# Patient Record
Sex: Female | Born: 1960
Health system: Southern US, Community
[De-identification: ages and names within clinical notes are randomized; demographics above are authoritative.]

## PROBLEM LIST (undated history)

## (undated) DIAGNOSIS — Z800 Family history of malignant neoplasm of digestive organs: Secondary | ICD-10-CM

## (undated) DIAGNOSIS — R112 Nausea with vomiting, unspecified: Secondary | ICD-10-CM

## (undated) DIAGNOSIS — N6019 Diffuse cystic mastopathy of unspecified breast: Secondary | ICD-10-CM

## (undated) DIAGNOSIS — Z9889 Other specified postprocedural states: Secondary | ICD-10-CM

## (undated) DIAGNOSIS — K625 Hemorrhage of anus and rectum: Secondary | ICD-10-CM

## (undated) DIAGNOSIS — Z1211 Encounter for screening for malignant neoplasm of colon: Secondary | ICD-10-CM

## (undated) DIAGNOSIS — M1990 Unspecified osteoarthritis, unspecified site: Secondary | ICD-10-CM

## (undated) DIAGNOSIS — E039 Hypothyroidism, unspecified: Secondary | ICD-10-CM

## (undated) DIAGNOSIS — T8859XA Other complications of anesthesia, initial encounter: Secondary | ICD-10-CM

## (undated) DIAGNOSIS — F419 Anxiety disorder, unspecified: Secondary | ICD-10-CM

## (undated) DIAGNOSIS — T4145XA Adverse effect of unspecified anesthetic, initial encounter: Secondary | ICD-10-CM

## (undated) DIAGNOSIS — K648 Other hemorrhoids: Secondary | ICD-10-CM

## (undated) DIAGNOSIS — B019 Varicella without complication: Secondary | ICD-10-CM

## (undated) HISTORY — DX: Varicella without complication: B01.9

## (undated) HISTORY — DX: Other hemorrhoids: K64.8

## (undated) HISTORY — DX: Diffuse cystic mastopathy of unspecified breast: N60.19

## (undated) HISTORY — DX: Encounter for screening for malignant neoplasm of colon: Z12.11

## (undated) HISTORY — DX: Family history of malignant neoplasm of digestive organs: Z80.0

## (undated) HISTORY — DX: Hemorrhage of anus and rectum: K62.5

## (undated) HISTORY — PX: BREAST EXCISIONAL BIOPSY: SUR124

## (undated) HISTORY — DX: Other specified postprocedural states: Z98.890

## (undated) HISTORY — DX: Hypothyroidism, unspecified: E03.9

---
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
## 1989-01-23 HISTORY — PX: LYMPH NODE BIOPSY: SHX201

## 2001-01-23 DIAGNOSIS — Z9889 Other specified postprocedural states: Secondary | ICD-10-CM

## 2001-01-23 HISTORY — DX: Other specified postprocedural states: Z98.890

## 2004-02-05 ENCOUNTER — Ambulatory Visit: Payer: Self-pay | Admitting: Unknown Physician Specialty

## 2005-03-21 ENCOUNTER — Ambulatory Visit: Payer: Self-pay | Admitting: Unknown Physician Specialty

## 2006-04-03 ENCOUNTER — Ambulatory Visit: Payer: Self-pay | Admitting: Unknown Physician Specialty

## 2007-04-09 ENCOUNTER — Ambulatory Visit: Payer: Self-pay | Admitting: General Surgery

## 2008-01-24 HISTORY — PX: COLONOSCOPY: SHX174

## 2008-03-20 ENCOUNTER — Ambulatory Visit: Payer: Self-pay | Admitting: General Surgery

## 2008-03-25 ENCOUNTER — Ambulatory Visit: Payer: Self-pay | Admitting: General Surgery

## 2008-04-23 ENCOUNTER — Ambulatory Visit: Payer: Self-pay | Admitting: General Surgery

## 2009-01-23 DIAGNOSIS — K648 Other hemorrhoids: Secondary | ICD-10-CM

## 2009-01-23 DIAGNOSIS — K625 Hemorrhage of anus and rectum: Secondary | ICD-10-CM

## 2009-01-23 HISTORY — DX: Hemorrhage of anus and rectum: K62.5

## 2009-01-23 HISTORY — DX: Other hemorrhoids: K64.8

## 2009-05-19 ENCOUNTER — Ambulatory Visit: Payer: Self-pay | Admitting: Unknown Physician Specialty

## 2009-05-19 ENCOUNTER — Ambulatory Visit: Payer: Self-pay | Admitting: General Surgery

## 2009-07-20 DIAGNOSIS — D239 Other benign neoplasm of skin, unspecified: Secondary | ICD-10-CM

## 2009-07-20 HISTORY — DX: Other benign neoplasm of skin, unspecified: D23.9

## 2010-05-23 ENCOUNTER — Ambulatory Visit: Payer: Self-pay | Admitting: General Surgery

## 2011-01-24 DIAGNOSIS — Z1211 Encounter for screening for malignant neoplasm of colon: Secondary | ICD-10-CM

## 2011-01-24 DIAGNOSIS — Z800 Family history of malignant neoplasm of digestive organs: Secondary | ICD-10-CM

## 2011-01-24 DIAGNOSIS — N6019 Diffuse cystic mastopathy of unspecified breast: Secondary | ICD-10-CM

## 2011-01-24 HISTORY — DX: Family history of malignant neoplasm of digestive organs: Z80.0

## 2011-01-24 HISTORY — DX: Encounter for screening for malignant neoplasm of colon: Z12.11

## 2011-01-24 HISTORY — DX: Diffuse cystic mastopathy of unspecified breast: N60.19

## 2011-05-31 ENCOUNTER — Ambulatory Visit: Payer: Self-pay | Admitting: General Surgery

## 2012-03-20 ENCOUNTER — Emergency Department: Payer: Self-pay | Admitting: Emergency Medicine

## 2012-04-06 ENCOUNTER — Encounter: Payer: Self-pay | Admitting: *Deleted

## 2012-04-06 DIAGNOSIS — Z9889 Other specified postprocedural states: Secondary | ICD-10-CM | POA: Insufficient documentation

## 2012-04-06 DIAGNOSIS — Z800 Family history of malignant neoplasm of digestive organs: Secondary | ICD-10-CM | POA: Insufficient documentation

## 2013-11-24 ENCOUNTER — Encounter: Payer: Self-pay | Admitting: *Deleted

## 2014-07-02 LAB — HM MAMMOGRAPHY

## 2014-10-05 ENCOUNTER — Ambulatory Visit (INDEPENDENT_AMBULATORY_CARE_PROVIDER_SITE_OTHER): Payer: 59 | Admitting: Family Medicine

## 2014-10-05 ENCOUNTER — Encounter: Payer: Self-pay | Admitting: Family Medicine

## 2014-10-05 ENCOUNTER — Encounter (INDEPENDENT_AMBULATORY_CARE_PROVIDER_SITE_OTHER): Payer: Self-pay

## 2014-10-05 VITALS — BP 98/62 | HR 61 | Temp 98.2°F | Ht 64.25 in | Wt 113.2 lb

## 2014-10-05 DIAGNOSIS — E039 Hypothyroidism, unspecified: Secondary | ICD-10-CM | POA: Diagnosis not present

## 2014-10-05 DIAGNOSIS — M8580 Other specified disorders of bone density and structure, unspecified site: Secondary | ICD-10-CM

## 2014-10-05 DIAGNOSIS — R631 Polydipsia: Secondary | ICD-10-CM

## 2014-10-05 DIAGNOSIS — Z13220 Encounter for screening for lipoid disorders: Secondary | ICD-10-CM

## 2014-10-05 DIAGNOSIS — Z0000 Encounter for general adult medical examination without abnormal findings: Secondary | ICD-10-CM

## 2014-10-05 DIAGNOSIS — M2550 Pain in unspecified joint: Secondary | ICD-10-CM | POA: Diagnosis not present

## 2014-10-05 LAB — LIPID PANEL
CHOL/HDL RATIO: 3
Cholesterol: 253 mg/dL — ABNORMAL HIGH (ref 0–200)
HDL: 100.9 mg/dL (ref >39.00)
LDL Cholesterol: 136 mg/dL — ABNORMAL HIGH (ref 0–99)
NONHDL: 151.86
TRIGLYCERIDES: 81 mg/dL (ref 0.0–149.0)
VLDL: 16.2 mg/dL (ref 0.0–40.0)

## 2014-10-05 LAB — BASIC METABOLIC PANEL
BUN: 18 mg/dL (ref 6–23)
CALCIUM: 9.9 mg/dL (ref 8.4–10.5)
CO2: 32 meq/L (ref 19–32)
CREATININE: 0.82 mg/dL (ref 0.40–1.20)
Chloride: 103 mEq/L (ref 96–112)
GFR: 77.81 mL/min (ref >60.00)
GLUCOSE: 73 mg/dL (ref 70–99)
Potassium: 4.7 mEq/L (ref 3.5–5.1)
Sodium: 142 mEq/L (ref 135–145)

## 2014-10-05 LAB — VITAMIN D 25 HYDROXY (VIT D DEFICIENCY, FRACTURES): VITD: 23.6 ng/mL — ABNORMAL LOW (ref 30.00–100.00)

## 2014-10-05 LAB — CBC
HEMATOCRIT: 42.1 % (ref 36.0–46.0)
HEMOGLOBIN: 14 g/dL (ref 12.0–15.0)
MCHC: 33.4 g/dL (ref 30.0–36.0)
MCV: 95.6 fl (ref 78.0–100.0)
Platelets: 189 10*3/uL (ref 150.0–400.0)
RBC: 4.4 Mil/uL (ref 3.87–5.11)
RDW: 13 % (ref 11.5–15.5)
WBC: 4.2 10*3/uL (ref 4.0–10.5)

## 2014-10-05 LAB — SEDIMENTATION RATE: SED RATE: 6 mm/h (ref 0–22)

## 2014-10-05 LAB — C-REACTIVE PROTEIN: CRP: <0.1 mg/dL — ABNORMAL LOW (ref 0.5–20.0)

## 2014-10-05 NOTE — Assessment & Plan Note (Signed)
Stable per patient. Followed by endocrinology.

## 2014-10-05 NOTE — Assessment & Plan Note (Signed)
Obtaining vitamin D today. Advised continuation of vitamin D 2000 international units daily. Encouraged patient to take calcium supplementation to total 1200 mg daily (including dietary sources).  DEXA scan ordered.

## 2014-10-05 NOTE — Assessment & Plan Note (Addendum)
Preventative health care up to date except for hepatitis C screening and HIV screening.

## 2014-10-05 NOTE — Patient Instructions (Addendum)
It was nice to see you today.  Continue your current medications. Continue your dose of Vitamin D. Take 600-800 mg of calcium daily (total recommended including diet is 1200 mg daily).    We will call with lab and xray results.  Use Tylenol (1000 mg three times daily as needed for pain). You can also use Ibuprofen or Aleve (short term).  We will call regarding the scheduling of your Bone density.  Follow up in ~ 3 months.  Take care  Dr. Lacinda Axon

## 2014-10-05 NOTE — Progress Notes (Signed)
Subjective:  Patient ID: Sandra Baker, female    DOB: 12/09/1960  Age: 54 y.o. MRN: [id number]  CC: Joint pain  HPI Sandra Baker is a 54 y.o. female presents to the clinic today to establish care.  Additionally, she has complaints of joint pain.  1) Preventative Healthcare  Pap smear: 2016  Mammogram/Breast exam: 2016  Colonoscopy: 2014  Immunizations: Up to date  Labs: In need of Lipid, TSH.  Diet/Exercise: Exercises regularly (pilates/yoga).  Smoking/tobacco use: No.   Wears seat belt: yes.   2) Joint pain  Patient reports that she has had diffuse joint pain for the past few months.  She reports pain in the hands, fingers, wrists, ankles, feet.  She reports some morning stiffness. She states that it improves with massage/rubbing and physical activity.  She's not taking any medication for her pain.  She states that it is intermittently troublesome. Max 6/10.  No associated fevers or chills. She denies any weight loss or weight gain but states that her weight fluctuates secondary to hypothyroidism.  PMH, Surgical Hx, Family Hx, Social History reviewed and updated as below. Past Medical History  Diagnosis Date  . Diffuse cystic mastopathy 2013  . Hemorrhage of rectum and anus 2011  . Internal hemorrhoids with other complication 8882  . Family history of malignant neoplasm of gastrointestinal tract 2013  . Special screening for malignant neoplasms, colon 2013  . History of lymph node biopsy 2003  . Chicken pox   . Hypothyroidism     Past Surgical History  Procedure Laterality Date  . Colonoscopy  2010    done by Dr. Jamal Collin  . Lymph node biopsy  1991    Family History  Problem Relation Age of Onset  . Colon cancer Other     family history of colon cancer  . Cancer Other     cousin- breast cancer  . Cancer Mother     colon  . Cancer Maternal Grandfather     colon    Social History  Substance Use Topics  . Smoking status: Never  Smoker   . Smokeless tobacco: Never Used  . Alcohol Use: Not on file   Review of Systems  Constitutional: Negative for fever and chills.  HENT: Negative.   Eyes: Negative.   Respiratory: Negative for shortness of breath.   Cardiovascular: Negative for chest pain.  Gastrointestinal: Negative for nausea, vomiting and constipation.  Endocrine: Positive for polydipsia.  Genitourinary: Negative.   Musculoskeletal: Positive for joint swelling and arthralgias.  Neurological: Negative for dizziness, light-headedness and headaches.   Objective:   Today's Vitals: BP 98/62 mmHg  Pulse 61  Temp(Src) 98.2 F (36.8 C) (Oral)  Ht 5' 4.25" (1.632 m)  Wt 113 lb 3 oz (51.342 kg)  BMI 19.28 kg/m2  SpO2 98%  Physical Exam  Constitutional: She is oriented to person, place, and time. She appears well-developed and well-nourished. No distress.  HENT:  Head: Normocephalic and atraumatic.  Normal TM's bilaterally.   Eyes: Conjunctivae are normal. No scleral icterus.  Neck: Neck supple. No thyromegaly present.  Cardiovascular: Normal rate and regular rhythm.   No murmur heard. Pulmonary/Chest: Effort normal. No respiratory distress. She has no wheezes. She has no rales.  Abdominal: Soft. She exhibits no distension. There is no tenderness. There is no rebound and no guarding.  Musculoskeletal: She exhibits no edema or tenderness.  Normal ROM of hands/fingers/wrists/Ankles. 1st DIP (right) with swelling noted (likely from OA). Remainder of MSK exam negative.  Lymphadenopathy:    She has no cervical adenopathy.  Neurological: She is alert and oriented to person, place, and time.  Skin: Skin is warm and dry. No rash noted.  Psychiatric:  Depressed mood and flat affect.    Assessment & Plan:   Problem List Items Addressed This Visit    Excessive thirst    Patient reported this as I was getting ready to exit the room. Will obtain urine osmolality and basic metabolic panel to assess for diabetes  insipidus.      Relevant Orders   Osmolality, urine   Hypothyroidism    Stable per patient. Followed by endocrinology.      Joint pain    Exam was negative other than first DIP on the right with swelling. I suspect that she is having pain secondary to underlying OA.  However, patient is concerned about autoimmune conditions as she has Hashimoto's thyroiditis. Obtaining workup today: CBC, BMP, ANA, ESR, CRP, anti-CCP, as well as x-rays of the bilateral hands. Advised Tylenol and/or NSAIDs as needed for pain.      Relevant Orders   CBC   Basic metabolic panel   Sed Rate (ESR)   C-reactive protein   Cyclic citrul peptide antibody, IgG   Antinuclear Antib (ANA)   DG HANDS 1 VIEW BILAT BALLCATCHERS   Osteopenia    Obtaining vitamin D today. Advised continuation of vitamin D 2000 international units daily. Encouraged patient to take calcium supplementation to total 1200 mg daily (including dietary sources).  DEXA scan ordered.      Relevant Orders   Vitamin D (25 hydroxy)   DG Bone Density   Preventative health care - Primary    Preventative health care up to date except for hepatitis C screening and HIV screening.        Other Visit Diagnoses    Screening for lipid disorders        Relevant Orders    Lipid panel      Outpatient Encounter Prescriptions as of 10/05/2014  Medication Sig  . ACZONE 7.5 % GEL   . Cholecalciferol (D 1000) 1000 UNITS capsule Take 1,000 Units by mouth 2 (two) times daily.  . Cyanocobalamin (VITAMIN B-12 CR) 1000 MCG TBCR Take by mouth.  . estrogen, conjugated,-medroxyprogesterone (PREMPRO) 0.3-1.5 MG per tablet Take 1 tablet by mouth daily.  Marland Kitchen levothyroxine (SYNTHROID, LEVOTHROID) 50 MCG tablet Take 50 mcg by mouth daily.  . Multiple Vitamin (MULTIVITAMIN) tablet Take 1 tablet by mouth daily.  . Omega-3 Fatty Acids (FISH OIL) 1000 MG CAPS Take by mouth as needed.  . temazepam (RESTORIL) 7.5 MG capsule Take 7.5 mg by mouth at bedtime as needed  for sleep.  Marland Kitchen zolpidem (AMBIEN) 5 MG tablet Take 0.25 tablets by mouth at bedtime.   No facility-administered encounter medications on file as of 10/05/2014.    Follow-up: 3 months.    Sandra Baker

## 2014-10-05 NOTE — Assessment & Plan Note (Signed)
Patient reported this as I was getting ready to exit the room. Will obtain urine osmolality and basic metabolic panel to assess for diabetes insipidus.

## 2014-10-05 NOTE — Assessment & Plan Note (Signed)
Exam was negative other than first DIP on the right with swelling. I suspect that she is having pain secondary to underlying OA.  However, patient is concerned about autoimmune conditions as she has Hashimoto's thyroiditis. Obtaining workup today: CBC, BMP, ANA, ESR, CRP, anti-CCP, as well as x-rays of the bilateral hands. Advised Tylenol and/or NSAIDs as needed for pain.

## 2014-10-06 LAB — OSMOLALITY, URINE: OSMOLALITY UR: 308 mosm/kg — AB (ref 390–1090)

## 2014-10-06 LAB — CYCLIC CITRUL PEPTIDE ANTIBODY, IGG

## 2014-10-06 LAB — ANA: ANA: NEGATIVE

## 2015-02-01 ENCOUNTER — Ambulatory Visit: Payer: 59 | Admitting: Family Medicine

## 2015-02-08 ENCOUNTER — Ambulatory Visit: Payer: 59 | Admitting: Family Medicine

## 2015-03-23 ENCOUNTER — Encounter: Payer: Self-pay | Admitting: Family Medicine

## 2015-04-06 ENCOUNTER — Ambulatory Visit: Payer: 59 | Attending: Family Medicine

## 2015-11-22 ENCOUNTER — Other Ambulatory Visit: Payer: Self-pay | Admitting: Obstetrics and Gynecology

## 2015-11-22 DIAGNOSIS — Z1231 Encounter for screening mammogram for malignant neoplasm of breast: Secondary | ICD-10-CM

## 2015-12-28 ENCOUNTER — Ambulatory Visit: Payer: 59

## 2016-01-03 ENCOUNTER — Ambulatory Visit
Admission: RE | Admit: 2016-01-03 | Discharge: 2016-01-03 | Disposition: A | Discharge status: Home / Self Care | Payer: 59 | Source: Ambulatory Visit | Attending: Obstetrics and Gynecology | Admitting: Obstetrics and Gynecology

## 2016-01-03 DIAGNOSIS — Z1231 Encounter for screening mammogram for malignant neoplasm of breast: Secondary | ICD-10-CM | POA: Insufficient documentation

## 2016-07-31 DIAGNOSIS — E038 Other specified hypothyroidism: Secondary | ICD-10-CM | POA: Diagnosis not present

## 2016-07-31 DIAGNOSIS — E063 Autoimmune thyroiditis: Secondary | ICD-10-CM | POA: Diagnosis not present

## 2016-08-04 ENCOUNTER — Ambulatory Visit: Payer: Self-pay | Admitting: Obstetrics and Gynecology

## 2016-08-07 DIAGNOSIS — E063 Autoimmune thyroiditis: Secondary | ICD-10-CM | POA: Diagnosis not present

## 2016-08-07 DIAGNOSIS — E038 Other specified hypothyroidism: Secondary | ICD-10-CM | POA: Diagnosis not present

## 2016-08-23 ENCOUNTER — Ambulatory Visit (INDEPENDENT_AMBULATORY_CARE_PROVIDER_SITE_OTHER): Payer: 59 | Admitting: Obstetrics and Gynecology

## 2016-08-23 ENCOUNTER — Encounter: Payer: Self-pay | Admitting: Obstetrics and Gynecology

## 2016-08-23 DIAGNOSIS — Z1231 Encounter for screening mammogram for malignant neoplasm of breast: Secondary | ICD-10-CM

## 2016-08-23 DIAGNOSIS — Z124 Encounter for screening for malignant neoplasm of cervix: Secondary | ICD-10-CM

## 2016-08-23 DIAGNOSIS — Z1239 Encounter for other screening for malignant neoplasm of breast: Secondary | ICD-10-CM

## 2016-08-23 DIAGNOSIS — Z01419 Encounter for gynecological examination (general) (routine) without abnormal findings: Secondary | ICD-10-CM | POA: Diagnosis not present

## 2016-08-23 NOTE — Patient Instructions (Signed)
Preventive Care 40-64 Years, Female Preventive care refers to lifestyle choices and visits with your health care provider that can promote health and wellness. What does preventive care include?  A yearly physical exam. This is also called an annual well check.  Dental exams once or twice a year.  Routine eye exams. Ask your health care provider how often you should have your eyes checked.  Personal lifestyle choices, including: ? Daily care of your teeth and gums. ? Regular physical activity. ? Eating a healthy diet. ? Avoiding tobacco and drug use. ? Limiting alcohol use. ? Practicing safe sex. ? Taking low-dose aspirin daily starting at age 54. ? Taking vitamin and mineral supplements as recommended by your health care provider. What happens during an annual well check? The services and screenings done by your health care provider during your annual well check will depend on your age, overall health, lifestyle risk factors, and family history of disease. Counseling Your health care provider may ask you questions about your:  Alcohol use.  Tobacco use.  Drug use.  Emotional well-being.  Home and relationship well-being.  Sexual activity.  Eating habits.  Work and work Statistician.  Method of birth control.  Menstrual cycle.  Pregnancy history.  Screening You may have the following tests or measurements:  Height, weight, and BMI.  Blood pressure.  Lipid and cholesterol levels. These may be checked every 5 years, or more frequently if you are over 54 years old.  Skin check.  Lung cancer screening. You may have this screening every year starting at age 54 if you have a 30-pack-year history of smoking and currently smoke or have quit within the past 15 years.  Fecal occult blood test (FOBT) of the stool. You may have this test every year starting at age 54.  Flexible sigmoidoscopy or colonoscopy. You may have a sigmoidoscopy every 5 years or a colonoscopy  every 10 years starting at age 30.  Hepatitis C blood test.  Hepatitis B blood test.  Sexually transmitted disease (STD) testing.  Diabetes screening. This is done by checking your blood sugar (glucose) after you have not eaten for a while (fasting). You may have this done every 1-3 years.  Mammogram. This may be done every 1-2 years. Talk to your health care provider about when you should start having regular mammograms. This may depend on whether you have a family history of breast cancer.  BRCA-related cancer screening. This may be done if you have a family history of breast, ovarian, tubal, or peritoneal cancers.  Pelvic exam and Pap test. This may be done every 3 years starting at age 54. Starting at age 54, this may be done every 5 years if you have a Pap test in combination with an HPV test.  Bone density scan. This is done to screen for osteoporosis. You may have this scan if you are at high risk for osteoporosis.  Discuss your test results, treatment options, and if necessary, the need for more tests with your health care provider. Vaccines Your health care provider may recommend certain vaccines, such as:  Influenza vaccine. This is recommended every year.  Tetanus, diphtheria, and acellular pertussis (Tdap, Td) vaccine. You may need a Td booster every 10 years.  Varicella vaccine. You may need this if you have not been vaccinated.  Zoster vaccine. You may need this after age 5.  Measles, mumps, and rubella (MMR) vaccine. You may need at least one dose of MMR if you were born in  1957 or later. You may also need a second dose.  Pneumococcal 13-valent conjugate (PCV13) vaccine. You may need this if you have certain conditions and were not previously vaccinated.  Pneumococcal polysaccharide (PPSV23) vaccine. You may need one or two doses if you smoke cigarettes or if you have certain conditions.  Meningococcal vaccine. You may need this if you have certain  conditions.  Hepatitis A vaccine. You may need this if you have certain conditions or if you travel or work in places where you may be exposed to hepatitis A.  Hepatitis B vaccine. You may need this if you have certain conditions or if you travel or work in places where you may be exposed to hepatitis B.  Haemophilus influenzae type b (Hib) vaccine. You may need this if you have certain conditions.  Talk to your health care provider about which screenings and vaccines you need and how often you need them. This information is not intended to replace advice given to you by your health care provider. Make sure you discuss any questions you have with your health care provider. Document Released: 02/05/2015 Document Revised: 09/29/2015 Document Reviewed: 11/10/2014 Elsevier Interactive Patient Education  2017 [employer].

## 2016-08-23 NOTE — Progress Notes (Signed)
Patient ID: Sandra Baker, female   DOB: Jun 20, 1960, 56 y.o.   MRN: [id number]     Gynecology Annual Exam  PCP: Coral Spikes, DO  Chief Complaint:  Chief Complaint  Patient presents with  . Gynecologic Exam    History of Present Illness:Patient is a 56 y.o. G2P0 presents for annual exam. The patient has no complaints today.   LMP: No LMP recorded. Patient is postmenopausal. NO postmenopausal bleeding, no significant vasomotor symptoms   The patient is sexually active. She denies dyspareunia.  The patient does perform self breast exams.  There is no notable family history of breast or ovarian cancer in her family.  The patient wears seatbelts: yes.   The patient has regular exercise: yes.    The patient denies current symptoms of depression.     Review of Systems: Review of Systems  Constitutional: Negative for chills and fever.  HENT: Negative for congestion.   Respiratory: Negative for cough and shortness of breath.   Cardiovascular: Negative for chest pain and palpitations.  Gastrointestinal: Negative for abdominal pain, constipation, diarrhea, heartburn, nausea and vomiting.  Genitourinary: Negative for dysuria, frequency and urgency.  Skin: Negative for itching and rash.  Neurological: Negative for dizziness and headaches.  Endo/Heme/Allergies: Negative for polydipsia.  Psychiatric/Behavioral: Negative for depression.    Past Medical History:  Past Medical History:  Diagnosis Date  . Chicken pox   . Diffuse cystic mastopathy 2013  . Family history of malignant neoplasm of gastrointestinal tract 2013  . Hemorrhage of rectum and anus 2011  . History of lymph node biopsy 2003  . Hypothyroidism   . Internal hemorrhoids with other complication 5643  . Special screening for malignant neoplasms, colon 2013    Past Surgical History:  Past Surgical History:  Procedure Laterality Date  . COLONOSCOPY  2010   done by Dr. Jamal Collin  . LYMPH NODE BIOPSY  1991     Gynecologic History:  No LMP recorded. Patient is postmenopausal. Last Pap: Results were: 07/22/15 NIL and HR HPV negative  Last mammogram: 01/03/16 Results were: BI-RAD I Obstetric History: G2P0  Family History:  Family History  Problem Relation Age of Onset  . Colon cancer Other        family history of colon cancer  . Cancer Other        cousin- breast cancer  . Cancer Mother        colon  . Cancer Maternal Grandfather        colon  . Breast cancer Cousin     Social History:  Social History   Social History  . Marital status: Married    Spouse name: N/A  . Number of children: N/A  . Years of education: N/A   Occupational History  . Not on file.   Social History Main Topics  . Smoking status: Never Smoker  . Smokeless tobacco: Never Used  . Alcohol use 0.0 oz/week  . Drug use: No  . Sexual activity: Yes    Birth control/ protection: None   Other Topics Concern  . Not on file   Social History Narrative  . No narrative on file    Allergies:  No Known Allergies  Medications: Prior to Admission medications   Medication Sig Start Date End Date Taking? Authorizing Provider  ACZONE 7.5 % GEL  09/01/14  Yes [provider]  levothyroxine (SYNTHROID, LEVOTHROID) 50 MCG tablet Take 50 mcg by mouth daily.   Yes [provider]  Multiple Vitamin (  MULTIVITAMIN) tablet Take 1 tablet by mouth daily.   Yes [provider]  zolpidem (AMBIEN) 5 MG tablet Take 0.25 tablets by mouth at bedtime. 09/10/14  Yes [provider]    Physical Exam Vitals: Blood pressure 106/60, pulse 65, height 5\' 2"  (1.575 m), weight 117 lb (53.1 kg).  General: NAD HEENT: normocephalic, anicteric Thyroid: no enlargement, no palpable nodules Pulmonary: No increased work of breathing, CTAB Cardiovascular: RRR, distal pulses 2+ Breast: Breast symmetrical, no tenderness, no palpable nodules or masses, no skin or nipple retraction present, no nipple  discharge.  No axillary or supraclavicular lymphadenopathy. Abdomen: NABS, soft, non-tender, non-distended.  Umbilicus without lesions.  No hepatomegaly, splenomegaly or masses palpable. No evidence of hernia  Genitourinary:  External: Normal external female genitalia.  Normal urethral meatus, normal  Bartholin's and Skene's glands.    Vagina: Normal vaginal mucosa, no evidence of prolapse.    Cervix: Grossly normal in appearance, no bleeding  Uterus: Non-enlarged, mobile, normal contour.  No CMT  Adnexa: ovaries non-enlarged, no adnexal masses  Rectal: deferred  Lymphatic: no evidence of inguinal lymphadenopathy Extremities: no edema, erythema, or tenderness Neurologic: Grossly intact Psychiatric: mood appropriate, affect full  Female chaperone present for pelvic and breast  portions of the physical exam    Assessment: 56 y.o. G2P0 No problem-specific Assessment & Plan notes found for this encounter.   Plan: Problem List Items Addressed This Visit    None    Visit Diagnoses    Screening for malignant neoplasm of cervix       Relevant Orders   PapIG, HPV, rfx 16/18   Breast screening       Relevant Orders   MM DIGITAL SCREENING BILATERAL   Encounter for gynecological examination without abnormal finding          1) Mammogram - recommend yearly screening mammogram.  Mammogram Was ordered today  2) STI screening was not offered  3) ASCCP guidelines and rational discussed.  Patient opts for yearly screening interval  4) Osteoporosis  - per USPTF routine screening DEXA at age 67  5) Routine healthcare maintenance including cholesterol, diabetes screening discussed managed by PCP  6) Colonoscopy -  Screening recommended starting at age 45 for average risk individuals, age 55 for individuals deemed at increased risk (including African Americans) and recommended to continue until age 1.  For patient age 30-85 individualized approach is recommended.  Gold standard screening is  via colonoscopy, Cologuard screening is an acceptable alternative for patient unwilling or unable to undergo colonoscopy.  "Colorectal cancer screening for average?risk adults: 2018 guideline update from the American Cancer Society"[place]: A Cancer Journal for Clinicians: Jun 21, 2016  - up to date  7) Follow up 1 year for routine annual

## 2016-08-24 ENCOUNTER — Telehealth: Payer: Self-pay

## 2016-08-24 NOTE — Telephone Encounter (Signed)
Kim from [facility] calling re pap on pt c no name on vial.  Will return vial for correction.

## 2016-08-25 NOTE — Telephone Encounter (Signed)
Vial received from [facility]. Labeled and resent KJ CMA

## 2016-08-29 LAB — PAPIG, HPV, RFX 16/18
HPV, HIGH-RISK: NEGATIVE
PAP SMEAR COMMENT: 0

## 2016-11-28 DIAGNOSIS — M19041 Primary osteoarthritis, right hand: Secondary | ICD-10-CM | POA: Diagnosis not present

## 2016-11-28 DIAGNOSIS — M19042 Primary osteoarthritis, left hand: Secondary | ICD-10-CM | POA: Diagnosis not present

## 2016-11-28 DIAGNOSIS — E039 Hypothyroidism, unspecified: Secondary | ICD-10-CM | POA: Diagnosis not present

## 2016-12-03 DIAGNOSIS — Z23 Encounter for immunization: Secondary | ICD-10-CM | POA: Diagnosis not present

## 2016-12-26 DIAGNOSIS — Z0000 Encounter for general adult medical examination without abnormal findings: Secondary | ICD-10-CM | POA: Diagnosis not present

## 2017-01-02 DIAGNOSIS — Z0000 Encounter for general adult medical examination without abnormal findings: Secondary | ICD-10-CM | POA: Diagnosis not present

## 2017-01-02 DIAGNOSIS — E7800 Pure hypercholesterolemia, unspecified: Secondary | ICD-10-CM | POA: Diagnosis not present

## 2017-02-09 ENCOUNTER — Ambulatory Visit
Admission: RE | Admit: 2017-02-09 | Discharge: 2017-02-09 | Disposition: A | Discharge status: Home / Self Care | Payer: 59 | Source: Ambulatory Visit | Attending: Obstetrics and Gynecology | Admitting: Obstetrics and Gynecology

## 2017-02-09 ENCOUNTER — Encounter: Payer: Self-pay | Admitting: Obstetrics and Gynecology

## 2017-02-09 DIAGNOSIS — Z1231 Encounter for screening mammogram for malignant neoplasm of breast: Secondary | ICD-10-CM | POA: Diagnosis not present

## 2017-02-09 DIAGNOSIS — Z1239 Encounter for other screening for malignant neoplasm of breast: Secondary | ICD-10-CM

## 2017-04-30 DIAGNOSIS — E7800 Pure hypercholesterolemia, unspecified: Secondary | ICD-10-CM | POA: Diagnosis not present

## 2017-05-07 DIAGNOSIS — M19041 Primary osteoarthritis, right hand: Secondary | ICD-10-CM | POA: Diagnosis not present

## 2017-05-07 DIAGNOSIS — E7800 Pure hypercholesterolemia, unspecified: Secondary | ICD-10-CM | POA: Diagnosis not present

## 2017-05-07 DIAGNOSIS — M19042 Primary osteoarthritis, left hand: Secondary | ICD-10-CM | POA: Diagnosis not present

## 2017-08-01 DIAGNOSIS — E038 Other specified hypothyroidism: Secondary | ICD-10-CM | POA: Diagnosis not present

## 2017-08-01 DIAGNOSIS — E063 Autoimmune thyroiditis: Secondary | ICD-10-CM | POA: Diagnosis not present

## 2017-08-08 DIAGNOSIS — G4700 Insomnia, unspecified: Secondary | ICD-10-CM | POA: Diagnosis not present

## 2017-08-08 DIAGNOSIS — E063 Autoimmune thyroiditis: Secondary | ICD-10-CM | POA: Diagnosis not present

## 2017-08-08 DIAGNOSIS — E038 Other specified hypothyroidism: Secondary | ICD-10-CM | POA: Diagnosis not present

## 2017-08-09 DIAGNOSIS — L821 Other seborrheic keratosis: Secondary | ICD-10-CM | POA: Diagnosis not present

## 2017-08-09 DIAGNOSIS — L578 Other skin changes due to chronic exposure to nonionizing radiation: Secondary | ICD-10-CM | POA: Diagnosis not present

## 2017-08-09 DIAGNOSIS — L820 Inflamed seborrheic keratosis: Secondary | ICD-10-CM | POA: Diagnosis not present

## 2017-11-01 DIAGNOSIS — Z0000 Encounter for general adult medical examination without abnormal findings: Secondary | ICD-10-CM | POA: Diagnosis not present

## 2017-11-01 DIAGNOSIS — E7800 Pure hypercholesterolemia, unspecified: Secondary | ICD-10-CM | POA: Diagnosis not present

## 2017-11-08 DIAGNOSIS — E039 Hypothyroidism, unspecified: Secondary | ICD-10-CM | POA: Diagnosis not present

## 2017-11-08 DIAGNOSIS — E7800 Pure hypercholesterolemia, unspecified: Secondary | ICD-10-CM | POA: Diagnosis not present

## 2017-11-08 DIAGNOSIS — Z0000 Encounter for general adult medical examination without abnormal findings: Secondary | ICD-10-CM | POA: Diagnosis not present

## 2017-11-20 ENCOUNTER — Encounter: Payer: Self-pay | Admitting: Obstetrics and Gynecology

## 2017-11-23 ENCOUNTER — Encounter: Payer: Self-pay | Admitting: Obstetrics and Gynecology

## 2017-11-23 ENCOUNTER — Ambulatory Visit (INDEPENDENT_AMBULATORY_CARE_PROVIDER_SITE_OTHER): Payer: 59 | Admitting: Obstetrics and Gynecology

## 2017-11-23 ENCOUNTER — Other Ambulatory Visit (HOSPITAL_COMMUNITY)
Admission: RE | Admit: 2017-11-23 | Discharge: 2017-11-23 | Disposition: A | Discharge status: Home / Self Care | Payer: 59 | Source: Ambulatory Visit | Attending: Obstetrics and Gynecology | Admitting: Obstetrics and Gynecology

## 2017-11-23 VITALS — BP 100/60 | Ht 62.0 in | Wt 116.0 lb

## 2017-11-23 DIAGNOSIS — Z124 Encounter for screening for malignant neoplasm of cervix: Secondary | ICD-10-CM | POA: Diagnosis not present

## 2017-11-23 DIAGNOSIS — Z1321 Encounter for screening for nutritional disorder: Secondary | ICD-10-CM | POA: Diagnosis not present

## 2017-11-23 DIAGNOSIS — Z1239 Encounter for other screening for malignant neoplasm of breast: Secondary | ICD-10-CM

## 2017-11-23 DIAGNOSIS — Z01419 Encounter for gynecological examination (general) (routine) without abnormal findings: Secondary | ICD-10-CM | POA: Diagnosis not present

## 2017-11-23 NOTE — Patient Instructions (Signed)
[facility] [address] [place] [address]  [facility] [place]  [address] [place] [address]  Phone: [phone]  

## 2017-11-23 NOTE — Progress Notes (Signed)
Gynecology Annual Exam  PCP: Coral Spikes, DO  Chief Complaint:  Chief Complaint  Patient presents with  . Gynecologic Exam    History of Present Illness:Patient is a 57 y.o. G2P2002 presents for annual exam. The patient has no complaints today.   LMP: No LMP recorded. Patient is postmenopausal. No PMB, no significant vasomotor symptoms.  Reports no problems with stress or urge incontinence.    The patient is sexually active. She denies dyspareunia.  The patient does perform self breast exams.  There is no notable family history of breast or ovarian cancer in her family.  The patient wears seatbelts: yes.   The patient has regular exercise: not asked.    The patient denies current symptoms of depression.     Review of Systems: Review of Systems  Constitutional: Negative for chills and fever.  HENT: Negative for congestion.   Respiratory: Negative for cough and shortness of breath.   Cardiovascular: Negative for chest pain and palpitations.  Gastrointestinal: Negative for abdominal pain, constipation, diarrhea, heartburn, nausea and vomiting.  Genitourinary: Negative for dysuria, frequency and urgency.  Skin: Negative for itching and rash.  Neurological: Negative for dizziness and headaches.  Endo/Heme/Allergies: Negative for polydipsia.  Psychiatric/Behavioral: Negative for depression.    Past Medical History:  Past Medical History:  Diagnosis Date  . Chicken pox   . Diffuse cystic mastopathy 2013  . Family history of malignant neoplasm of gastrointestinal tract 2013  . Hemorrhage of rectum and anus 2011  . History of lymph node biopsy 2003  . Hypothyroidism   . Internal hemorrhoids with other complication 6294  . Special screening for malignant neoplasms, colon 2013    Past Surgical History:  Past Surgical History:  Procedure Laterality Date  . BREAST BIOPSY Left 1980's    lympnode bx neg  . COLONOSCOPY  2010   done by Dr. Jamal Collin  . LYMPH NODE BIOPSY   1991    Gynecologic History:  No LMP recorded. Patient is postmenopausal. Last Pap: Results were: 08/23/16 NIL and HR HPV negative  Last mammogram:02/09/17 Results were: BI-RAD I  Obstetric History: [id number]  Family History:  Family History  Problem Relation Age of Onset  . Colon cancer Other        family history of colon cancer  . Cancer Other 75       cousin- breast cancer  . Cancer Mother 17       colon  . Cancer Maternal Grandfather        colon  . Breast cancer Cousin   . Cancer Cousin 17    Social History:  Social History   Socioeconomic History  . Marital status: Married    Spouse name: Not on file  . Number of children: Not on file  . Years of education: Not on file  . Highest education level: Not on file  Occupational History  . Not on file  Social Needs  . Financial resource strain: Not on file  . Food insecurity:    Worry: Not on file    Inability: Not on file  . Transportation needs:    Medical: Not on file    Non-medical: Not on file  Tobacco Use  . Smoking status: Never Smoker  . Smokeless tobacco: Never Used  Substance and Sexual Activity  . Alcohol use: Yes    Alcohol/week: 0.0 standard drinks  . Drug use: No  . Sexual activity: Yes    Birth control/protection: None  Lifestyle  .  Physical activity:    Days per week: Not on file    Minutes per session: Not on file  . Stress: Not on file  Relationships  . Social connections:    Talks on phone: Not on file    Gets together: Not on file    Attends religious service: Not on file    Active member of club or organization: Not on file    Attends meetings of clubs or organizations: Not on file    Relationship status: Not on file  . Intimate partner violence:    Fear of current or ex partner: Not on file    Emotionally abused: Not on file    Physically abused: Not on file    Forced sexual activity: Not on file  Other Topics Concern  . Not on file  Social History Narrative  . Not on file     Allergies:  No Known Allergies  Medications: Prior to Admission medications   Medication Sig Start Date End Date Taking? Authorizing Provider  levothyroxine (SYNTHROID, LEVOTHROID) 50 MCG tablet Take 50 mcg by mouth daily.   Yes [provider]  Multiple Vitamin (MULTIVITAMIN) tablet Take 1 tablet by mouth daily.   Yes [provider]  traZODone (DESYREL) 50 MG tablet Take by mouth. 11/08/17 11/08/18 Yes [provider]    Physical Exam Vitals: Blood pressure 100/60, height 5\' 2"  (1.575 m), weight 116 lb (52.6 kg).  General: NAD HEENT: normocephalic, anicteric Thyroid: no enlargement, no palpable nodules Pulmonary: No increased work of breathing, CTAB Cardiovascular: RRR, distal pulses 2+ Breast: Breast symmetrical, no tenderness, no palpable nodules or masses, no skin or nipple retraction present, no nipple discharge.  No axillary or supraclavicular lymphadenopathy. Abdomen: NABS, soft, non-tender, non-distended.  Umbilicus without lesions.  No hepatomegaly, splenomegaly or masses palpable. No evidence of hernia  Genitourinary:  External: Normal external female genitalia.  Normal urethral meatus, normal Bartholin's and Skene's glands.    Vagina: Normal vaginal mucosa, no evidence of prolapse.    Cervix: Grossly normal in appearance, no bleeding  Uterus: Non-enlarged, mobile, normal contour.  No CMT  Adnexa: ovaries non-enlarged, no adnexal masses  Rectal: deferred  Lymphatic: no evidence of inguinal lymphadenopathy Extremities: no edema, erythema, or tenderness Neurologic: Grossly intact Psychiatric: mood appropriate, affect full  Female chaperone present for pelvic and breast  portions of the physical exam     Assessment: 57 y.o. G2P2002 routine annual exam  Plan: Problem List Items Addressed This Visit    None    Visit Diagnoses    Encounter for gynecological examination without abnormal finding    -  Primary   Relevant Orders    Vitamin D (25 hydroxy)   Screening for malignant neoplasm of cervix       Relevant Orders   Cytology - PAP   Breast screening       Relevant Orders   MM 3D SCREEN BREAST BILATERAL   Encounter for vitamin deficiency screening       Relevant Orders   Vitamin D (25 hydroxy)      1) Mammogram - recommend yearly screening mammogram.  Mammogram Is up to date - order placed for January 2020  2) STI screening  was notoffered and therefore not obtained  3) ASCCP guidelines and rational discussed.  Patient opts for yearly screening interval  4) Osteoporosis  - per USPTF routine screening DEXA at age 50 - FRAX 34 year major fracture risk 5.9%,  10 year hip fracture risk 0.6%,  if taking into account 2011 DEXA results modifies risk to 7.4% and 1.1% respectively.  Paternal history of hip fracture takes the risk to 14.0 and 1.1%.  - Check vitamin D level  Consider FDA-approved medical therapies in postmenopausal women and men aged 67 years and older, based on the following: a) A hip or vertebral (clinical or morphometric) fracture b) T-score ? -2.5 at the femoral neck or spine after appropriate evaluation to exclude secondary causes C) Low bone mass (T-score between -1.0 and -2.5 at the femoral neck or spine) and a 10-year probability of a hip fracture ? 3% or a 10-year probability of a major osteoporosis-related fracture ? 20% based on the [place]-adapted WHO algorithm   5) Routine healthcare maintenance including cholesterol, diabetes screening discussed managed by PCP, last saw Dr. Netty Starring earlier this month  6) Colonoscopy - up to date 03/25/2008  7) Return in about 1 year (around 11/24/2018) for annual.    Malachy Mood, MD Mosetta Pigeon, [place] 11/23/2017, 8:58 AM

## 2017-11-24 LAB — VITAMIN D 25 HYDROXY (VIT D DEFICIENCY, FRACTURES): VIT D 25 HYDROXY: 29.4 ng/mL — AB (ref 30.0–100.0)

## 2017-11-27 LAB — CYTOLOGY - PAP
DIAGNOSIS: NEGATIVE
HPV (WINDOPATH): NOT DETECTED

## 2018-01-10 DIAGNOSIS — Z1283 Encounter for screening for malignant neoplasm of skin: Secondary | ICD-10-CM | POA: Diagnosis not present

## 2018-01-10 DIAGNOSIS — D225 Melanocytic nevi of trunk: Secondary | ICD-10-CM | POA: Diagnosis not present

## 2018-01-10 DIAGNOSIS — D2230 Melanocytic nevi of unspecified part of face: Secondary | ICD-10-CM | POA: Diagnosis not present

## 2018-02-19 ENCOUNTER — Ambulatory Visit
Admission: RE | Admit: 2018-02-19 | Discharge: 2018-02-19 | Disposition: A | Discharge status: Home / Self Care | Payer: BLUE CROSS/BLUE SHIELD | Source: Ambulatory Visit | Attending: Obstetrics and Gynecology | Admitting: Obstetrics and Gynecology

## 2018-02-19 DIAGNOSIS — Z1239 Encounter for other screening for malignant neoplasm of breast: Secondary | ICD-10-CM | POA: Diagnosis present

## 2018-11-29 ENCOUNTER — Ambulatory Visit (INDEPENDENT_AMBULATORY_CARE_PROVIDER_SITE_OTHER): Payer: BLUE CROSS/BLUE SHIELD | Admitting: Obstetrics and Gynecology

## 2018-11-29 ENCOUNTER — Other Ambulatory Visit (HOSPITAL_COMMUNITY)
Admission: RE | Admit: 2018-11-29 | Discharge: 2018-11-29 | Disposition: A | Discharge status: Home / Self Care | Payer: BC Managed Care – PPO | Source: Ambulatory Visit | Attending: Obstetrics and Gynecology | Admitting: Obstetrics and Gynecology

## 2018-11-29 ENCOUNTER — Encounter: Payer: Self-pay | Admitting: Obstetrics and Gynecology

## 2018-11-29 ENCOUNTER — Other Ambulatory Visit: Payer: Self-pay

## 2018-11-29 VITALS — BP 106/64 | HR 71 | Ht 62.0 in | Wt 111.0 lb

## 2018-11-29 DIAGNOSIS — Z124 Encounter for screening for malignant neoplasm of cervix: Secondary | ICD-10-CM | POA: Insufficient documentation

## 2018-11-29 DIAGNOSIS — Z01419 Encounter for gynecological examination (general) (routine) without abnormal findings: Secondary | ICD-10-CM | POA: Diagnosis not present

## 2018-11-29 DIAGNOSIS — Z1239 Encounter for other screening for malignant neoplasm of breast: Secondary | ICD-10-CM

## 2018-11-29 NOTE — Patient Instructions (Signed)
[facility] [address] [place] [address]  [facility]  [address] [place] [address]  Phone: [phone]  

## 2018-11-29 NOTE — Progress Notes (Signed)
Gynecology Annual Exam  PCP: Dion Body, MD  Chief Complaint: No chief complaint on file.   History of Present Illness:Patient is a 58 y.o. G2P2002 presents for annual exam. The patient has no complaints today.   LMP: No LMP recorded. Patient is postmenopausal. No bleeding concerns  The patient is sexually active. She denies dyspareunia.  The patient does perform self breast exams.  There is no notable family history of breast or ovarian cancer in her family.  The patient wears seatbelts: yes.   The patient has regular exercise: not asked.    The patient denies current symptoms of depression.     Review of Systems: Review of Systems  Constitutional: Negative for chills and fever.  HENT: Negative for congestion.   Respiratory: Negative for cough and shortness of breath.   Cardiovascular: Negative for chest pain and palpitations.  Gastrointestinal: Negative for abdominal pain, constipation, diarrhea, heartburn, nausea and vomiting.  Genitourinary: Negative for dysuria, frequency and urgency.  Skin: Negative for itching and rash.  Neurological: Negative for dizziness and headaches.  Endo/Heme/Allergies: Negative for polydipsia.  Psychiatric/Behavioral: Negative for depression.    Past Medical History:  Past Medical History:  Diagnosis Date  . Chicken pox   . Diffuse cystic mastopathy 2013  . Family history of malignant neoplasm of gastrointestinal tract 2013  . Hemorrhage of rectum and anus 2011  . History of lymph node biopsy 2003  . Hypothyroidism   . Internal hemorrhoids with other complication [id number]  . Special screening for malignant neoplasms, colon 2013    Past Surgical History:  Past Surgical History:  Procedure Laterality Date  . BREAST EXCISIONAL BIOPSY Left 1980's    lympnode bx neg  . COLONOSCOPY  2010   done by Dr. Jamal Collin  . LYMPH NODE BIOPSY  1991    Gynecologic History:  No LMP recorded. Patient is postmenopausal. Last Pap: Results  were: 11/23/2017 NIL and HR HPV negative  Last mammogram: 02/19/2018 Results were: BI-RAD I  Obstetric [id number]  Family History:  Family History  Problem Relation Age of Onset  . Colon cancer Other        family history of colon cancer  . Cancer Other 62       cousin- breast cancer  . Cancer Mother 72       colon  . Cancer Maternal Grandfather        colon  . Cancer Cousin 50  . Breast cancer Cousin        x 2    Social History:  Social History   Socioeconomic History  . Marital status: Married    Spouse name: Not on file  . Number of children: Not on file  . Years of education: Not on file  . Highest education level: Not on file  Occupational History  . Not on file  Social Needs  . Financial resource strain: Not on file  . Food insecurity    Worry: Not on file    Inability: Not on file  . Transportation needs    Medical: Not on file    Non-medical: Not on file  Tobacco Use  . Smoking status: Never Smoker  . Smokeless tobacco: Never Used  Substance and Sexual Activity  . Alcohol use: Yes    Alcohol/week: 0.0 standard drinks  . Drug use: No  . Sexual activity: Yes    Birth control/protection: None  Lifestyle  . Physical activity    Days per week: Not on file  Minutes per session: Not on file  . Stress: Not on file  Relationships  . Social Herbalist on phone: Not on file    Gets together: Not on file    Attends religious service: Not on file    Active member of club or organization: Not on file    Attends meetings of clubs or organizations: Not on file    Relationship status: Not on file  . Intimate partner violence    Fear of current or ex partner: Not on file    Emotionally abused: Not on file    Physically abused: Not on file    Forced sexual activity: Not on file  Other Topics Concern  . Not on file  Social History Narrative  . Not on file    Allergies:  No Known Allergies  Medications: Prior to Admission medications    Medication Sig Start Date End Date Taking? Authorizing Provider  levothyroxine (SYNTHROID, LEVOTHROID) 50 MCG tablet Take 50 mcg by mouth daily.    [provider]  Multiple Vitamin (MULTIVITAMIN) tablet Take 1 tablet by mouth daily.    [provider]  traZODone (DESYREL) 50 MG tablet Take by mouth. 11/08/17 11/08/18  [provider]    Physical Exam Vitals: There were no vitals taken for this visit.  General: NAD, well nourished, appears stated age 88: normocephalic, anicteric Thyroid: no enlargement, no palpable nodules Pulmonary: No increased work of breathing, CTAB Cardiovascular: RRR, distal pulses 2+ Breast: Breast symmetrical, no tenderness, no palpable nodules or masses, no skin or nipple retraction present, no nipple discharge.  No axillary or supraclavicular lymphadenopathy. Abdomen: NABS, soft, non-tender, non-distended.  Umbilicus without lesions.  No hepatomegaly, splenomegaly or masses palpable. No evidence of hernia  Genitourinary:  External: Normal external female genitalia.  Normal urethral meatus, normal Bartholin's and Skene's glands.    Vagina: Normal vaginal mucosa, no evidence of prolapse.    Cervix: Grossly normal in appearance, no bleeding  Uterus: Non-enlarged, mobile, normal contour.  No CMT  Adnexa: ovaries non-enlarged, no adnexal masses  Rectal: deferred  Lymphatic: no evidence of inguinal lymphadenopathy Extremities: no edema, erythema, or tenderness Neurologic: Grossly intact Psychiatric: mood appropriate, affect full  Female chaperone present for pelvic and breast  portions of the physical exam     Assessment: 58 y.o. G2P2002 routine annual exam  Plan: Problem List Items Addressed This Visit    None      1) Mammogram - recommend yearly screening mammogram.  Mammogram Is up to date  2) STI screening  was notoffered and therefore not obtained  3) ASCCP guidelines and rational discussed.  Patient opts for yearly  screening interval  4) Osteoporosis  - per USPTF routine screening DEXA at age 20 - FRAX 75 year major fracture risk 5.9%,  10 year hip fracture risk 0.6%, if taking into account 2011 DEXA results modifies risk to 7.4% and 1.1% respectively.  Paternal history of hip fracture takes the risk to 14.0 and 1.1%.   5) Routine healthcare maintenance including cholesterol, diabetes screening discussed managed by PCP  6) Colonoscopy 09/05/2018 Dr Alice Reichert  7) No follow-ups on file.    Malachy Mood, MD Mosetta Pigeon, [facility] 11/29/2018, 8:33 AM

## 2018-12-02 LAB — CYTOLOGY - PAP
Comment: NEGATIVE
Diagnosis: NEGATIVE
High risk HPV: NEGATIVE

## 2019-01-21 ENCOUNTER — Other Ambulatory Visit: Payer: Self-pay | Admitting: Obstetrics and Gynecology

## 2019-01-21 ENCOUNTER — Telehealth: Payer: Self-pay

## 2019-01-21 MED ORDER — NITROFURANTOIN MONOHYD MACRO 100 MG PO CAPS: 100.0000 mg | ORAL_CAPSULE | Freq: Two times a day (BID) | ORAL | 0 refills | Status: AC

## 2019-01-21 NOTE — Telephone Encounter (Signed)
Pt calling to see if AMS will call in rx for UTI?  [phone]

## 2019-02-27 ENCOUNTER — Ambulatory Visit
Admission: RE | Admit: 2019-02-27 | Discharge: 2019-02-27 | Disposition: A | Discharge status: Home / Self Care | Payer: BC Managed Care – PPO | Source: Ambulatory Visit | Attending: Obstetrics and Gynecology | Admitting: Obstetrics and Gynecology

## 2019-02-27 DIAGNOSIS — Z1231 Encounter for screening mammogram for malignant neoplasm of breast: Secondary | ICD-10-CM | POA: Diagnosis present

## 2019-02-27 DIAGNOSIS — Z1239 Encounter for other screening for malignant neoplasm of breast: Secondary | ICD-10-CM

## 2019-03-11 ENCOUNTER — Other Ambulatory Visit: Payer: Self-pay | Admitting: Surgery

## 2019-03-14 ENCOUNTER — Encounter
Admission: RE | Admit: 2019-03-14 | Discharge: 2019-03-14 | Disposition: A | Discharge status: Home / Self Care | Payer: BC Managed Care – PPO | Source: Ambulatory Visit | Attending: Surgery | Admitting: Surgery

## 2019-03-14 DIAGNOSIS — Z01818 Encounter for other preprocedural examination: Secondary | ICD-10-CM | POA: Insufficient documentation

## 2019-03-14 HISTORY — DX: Anxiety disorder, unspecified: F41.9

## 2019-03-14 HISTORY — DX: Unspecified osteoarthritis, unspecified site: M19.90

## 2019-03-14 HISTORY — DX: Other complications of anesthesia, initial encounter: T88.59XA

## 2019-03-14 HISTORY — DX: Other specified postprocedural states: Z98.890

## 2019-03-14 HISTORY — DX: Nausea with vomiting, unspecified: R11.2

## 2019-03-14 NOTE — Patient Instructions (Signed)
Your procedure is scheduled on: 03-19-19 [facility] Report to Same Day Surgery 2nd floor medical mall [facility] Entrance-take elevator on left to 2nd floor.  Check in with surgery information desk.) To find out your arrival time please call [phone] between 1PM - 3PM on 03-18-19 TUESDAY  Remember: Instructions that are not followed completely may result in serious medical risk, up to and including death, or upon the discretion of your surgeon and anesthesiologist your surgery may need to be rescheduled.    _x___ 1. Do not eat food after midnight the night before your procedure. NO GUM OR CANDY AFTER MIDNIGHT. You may drink clear liquids up to 2 hours before you are scheduled to arrive at the hospital for your procedure.  Do not drink clear liquids within 2 hours of your scheduled arrival to the hospital.  Clear liquids include  --Water or Apple juice without pulp  --Gatorade  --Black Coffee or Clear Tea (No milk, no creamers, do not add anything to the coffee or Tea   ____Ensure clear carbohydrate drink on the way to the hospital for bariatric patients  _X___Ensure clear carbohydrate drink 2 HOURS PRIOR TO ARRIVAL TIME TO HOSPITAL    __x__ 2. No Alcohol for 24 hours before or after surgery.   __x__3. No Smoking or e-cigarettes for 24 prior to surgery.  Do not use any chewable tobacco products for at least 6 hour prior to surgery   ____  4. Bring all medications with you on the day of surgery if instructed.    __x__ 5. Notify your doctor if there is any change in your medical condition     (cold, fever, infections).    x___6. On the morning of surgery brush your teeth with toothpaste and water.  You may rinse your mouth with mouth wash if you wish.  Do not swallow any toothpaste or mouthwash.   Do not wear jewelry, make-up, hairpins, clips or nail polish.  Do not wear lotions, powders, or perfumes.  Do not shave 48 hours prior to surgery. Men may shave face and neck.  Do not  bring valuables to the hospital.    [facility] is not responsible for any belongings or valuables.               Contacts, dentures or bridgework may not be worn into surgery.  Leave your suitcase in the car. After surgery it may be brought to your room.  For patients admitted to the hospital, discharge time is determined by your  treatment team.  _  Patients discharged the day of surgery will not be allowed to drive home.  You will need someone to drive you home and stay with you the night of your procedure.    Please read over the following fact sheets that you were given:   [facility] Preparing for Surgery   _x___ TAKE THE FOLLOWING MEDICATION THE MORNING OF SURGERY WITH A SMALL SIP OF WATER. These include:  1. SYNTHROID (LEVOTHYROXINE)  2.  3.  4.  5.  6.  ____Fleets enema or Magnesium Citrate as directed.   _x___ Use CHG Soap or sage wipes as directed on instruction sheet   ____ Use inhalers on the day of surgery and bring to hospital day of surgery  ____ Stop Metformin and Janumet 2 days prior to surgery.    ____ Take 1/2 of usual insulin dose the night before surgery and none on the morning surgery.   ____ Follow recommendations from Cardiologist,  Pulmonologist or PCP regarding stopping Aspirin, Coumadin, Plavix ,Eliquis, Effient, or Pradaxa, and Pletal.  X____Stop Anti-inflammatories such as Advil, Aleve, Ibuprofen, Motrin, Naproxen, Naprosyn, Goodies powders or aspirin products NOW-OK to take Tylenol   ____ Stop supplements until after surgery.     ____ Bring C-Pap to the hospital.

## 2019-03-17 ENCOUNTER — Other Ambulatory Visit
Admission: RE | Admit: 2019-03-17 | Discharge: 2019-03-17 | Disposition: A | Discharge status: Home / Self Care | Payer: BC Managed Care – PPO | Source: Ambulatory Visit | Attending: Surgery | Admitting: Surgery

## 2019-03-17 ENCOUNTER — Other Ambulatory Visit: Payer: Self-pay

## 2019-03-17 DIAGNOSIS — Z01812 Encounter for preprocedural laboratory examination: Secondary | ICD-10-CM | POA: Insufficient documentation

## 2019-03-17 DIAGNOSIS — Z20822 Contact with and (suspected) exposure to covid-19: Secondary | ICD-10-CM | POA: Insufficient documentation

## 2019-03-17 LAB — SARS CORONAVIRUS 2 (TAT 6-24 HRS): SARS Coronavirus 2: NEGATIVE

## 2019-03-19 ENCOUNTER — Ambulatory Visit: Payer: BC Managed Care – PPO | Admitting: Anesthesiology

## 2019-03-19 ENCOUNTER — Encounter
Admission: RE | Disposition: A | Discharge status: Home / Self Care | Payer: Self-pay | Source: Home / Self Care | Attending: Surgery

## 2019-03-19 ENCOUNTER — Other Ambulatory Visit: Payer: Self-pay

## 2019-03-19 ENCOUNTER — Ambulatory Visit
Admission: RE | Admit: 2019-03-19 | Discharge: 2019-03-19 | Disposition: A | Discharge status: Home / Self Care | Payer: BC Managed Care – PPO | Attending: Surgery | Admitting: Surgery

## 2019-03-19 ENCOUNTER — Encounter: Payer: Self-pay | Admitting: Surgery

## 2019-03-19 DIAGNOSIS — E039 Hypothyroidism, unspecified: Secondary | ICD-10-CM | POA: Diagnosis not present

## 2019-03-19 DIAGNOSIS — M67432 Ganglion, left wrist: Secondary | ICD-10-CM | POA: Diagnosis present

## 2019-03-19 DIAGNOSIS — Z79890 Hormone replacement therapy (postmenopausal): Secondary | ICD-10-CM | POA: Diagnosis not present

## 2019-03-19 HISTORY — PX: GANGLION CYST EXCISION: SHX1691

## 2019-03-19 SURGERY — EXCISION, GANGLION CYST, WRIST
Anesthesia: Regional | Site: Wrist | Laterality: Left

## 2019-03-19 MED ORDER — LIDOCAINE HCL (PF) 1 % IJ SOLN
INTRAMUSCULAR | Status: AC
  Filled 2019-03-19: qty 30

## 2019-03-19 MED ORDER — PROPOFOL 500 MG/50ML IV EMUL
INTRAVENOUS | Status: DC | PRN
  Administered 2019-03-19: 100 ug/kg/min via INTRAVENOUS

## 2019-03-19 MED ORDER — PROPOFOL 10 MG/ML IV BOLUS
INTRAVENOUS | Status: DC | PRN
  Administered 2019-03-19 (×3): 10 mg via INTRAVENOUS

## 2019-03-19 MED ORDER — DEXAMETHASONE SODIUM PHOSPHATE 10 MG/ML IJ SOLN
INTRAMUSCULAR | Status: DC | PRN
  Administered 2019-03-19: 10 mg via INTRAVENOUS

## 2019-03-19 MED ORDER — ONDANSETRON HCL 4 MG/2ML IJ SOLN
INTRAMUSCULAR | Status: DC | PRN
  Administered 2019-03-19: 4 mg via INTRAVENOUS

## 2019-03-19 MED ORDER — POTASSIUM CHLORIDE IN NACL 20-0.9 MEQ/L-% IV SOLN: INTRAVENOUS | Status: DC

## 2019-03-19 MED ORDER — CEFAZOLIN SODIUM-DEXTROSE 2-3 GM-%(50ML) IV SOLR
INTRAVENOUS | Status: DC | PRN
  Administered 2019-03-19: 2 g via INTRAVENOUS

## 2019-03-19 MED ORDER — BUPIVACAINE HCL (PF) 0.5 % IJ SOLN
INTRAMUSCULAR | Status: DC | PRN
  Administered 2019-03-19: 5 mL

## 2019-03-19 MED ORDER — MIDAZOLAM HCL 2 MG/2ML IJ SOLN
INTRAMUSCULAR | Status: AC
  Filled 2019-03-19: qty 2

## 2019-03-19 MED ORDER — SCOPOLAMINE 1 MG/3DAYS TD PT72: 1.0000 | MEDICATED_PATCH | Freq: Once | TRANSDERMAL | Status: DC

## 2019-03-19 MED ORDER — METOCLOPRAMIDE HCL 5 MG/ML IJ SOLN: 5.0000 mg | Freq: Three times a day (TID) | INTRAMUSCULAR | Status: DC | PRN

## 2019-03-19 MED ORDER — FAMOTIDINE 20 MG PO TABS: 20.0000 mg | ORAL_TABLET | Freq: Once | ORAL | Status: AC

## 2019-03-19 MED ORDER — CEFAZOLIN SODIUM-DEXTROSE 2-4 GM/100ML-% IV SOLN
INTRAVENOUS | Status: AC
  Filled 2019-03-19: qty 100

## 2019-03-19 MED ORDER — SCOPOLAMINE 1 MG/3DAYS TD PT72
MEDICATED_PATCH | TRANSDERMAL | Status: AC
  Administered 2019-03-19: 08:00:00 1.5 mg via TRANSDERMAL
  Filled 2019-03-19: qty 1

## 2019-03-19 MED ORDER — MIDAZOLAM HCL 2 MG/2ML IJ SOLN
INTRAMUSCULAR | Status: DC | PRN
  Administered 2019-03-19: 2 mg via INTRAVENOUS

## 2019-03-19 MED ORDER — SEVOFLURANE IN SOLN
RESPIRATORY_TRACT | Status: AC
  Filled 2019-03-19: qty 250

## 2019-03-19 MED ORDER — LIDOCAINE HCL (PF) 0.5 % IJ SOLN
INTRAMUSCULAR | Status: AC
  Filled 2019-03-19: qty 50

## 2019-03-19 MED ORDER — BUPIVACAINE HCL (PF) 0.5 % IJ SOLN
INTRAMUSCULAR | Status: AC
  Filled 2019-03-19: qty 30

## 2019-03-19 MED ORDER — CEFAZOLIN SODIUM-DEXTROSE 2-4 GM/100ML-% IV SOLN: 2.0000 g | INTRAVENOUS | Status: DC

## 2019-03-19 MED ORDER — CHLORHEXIDINE GLUCONATE 4 % EX LIQD
60.0000 mL | Freq: Once | CUTANEOUS | Status: AC
  Administered 2019-03-19: 4 via TOPICAL

## 2019-03-19 MED ORDER — METOCLOPRAMIDE HCL 10 MG PO TABS: 5.0000 mg | ORAL_TABLET | Freq: Three times a day (TID) | ORAL | Status: DC | PRN

## 2019-03-19 MED ORDER — FAMOTIDINE 20 MG PO TABS
ORAL_TABLET | ORAL | Status: AC
  Administered 2019-03-19: 20 mg via ORAL
  Filled 2019-03-19: qty 1

## 2019-03-19 MED ORDER — LACTATED RINGERS IV SOLN: INTRAVENOUS | Status: DC

## 2019-03-19 MED ORDER — LIDOCAINE HCL (PF) 0.5 % IJ SOLN
INTRAMUSCULAR | Status: DC | PRN
  Administered 2019-03-19: 50 mL via INTRAVENOUS

## 2019-03-19 MED ORDER — ONDANSETRON HCL 4 MG/2ML IJ SOLN: 4.0000 mg | Freq: Four times a day (QID) | INTRAMUSCULAR | Status: DC | PRN

## 2019-03-19 MED ORDER — FENTANYL CITRATE (PF) 100 MCG/2ML IJ SOLN
INTRAMUSCULAR | Status: AC
  Filled 2019-03-19: qty 2

## 2019-03-19 MED ORDER — ONDANSETRON HCL 4 MG PO TABS: 4.0000 mg | ORAL_TABLET | Freq: Four times a day (QID) | ORAL | Status: DC | PRN

## 2019-03-19 SURGICAL SUPPLY — 33 items
BNDG COHESIVE 4X5 TAN STRL (GAUZE/BANDAGES/DRESSINGS) ×3 IMPLANT
BNDG ESMARK 4X12 TAN STRL LF (GAUZE/BANDAGES/DRESSINGS) ×3 IMPLANT
CANISTER SUCT 1200ML W/VALVE (MISCELLANEOUS) ×3 IMPLANT
CHLORAPREP W/TINT 26 (MISCELLANEOUS) ×4 IMPLANT
CLOSURE WOUND 1/4X4 (GAUZE/BANDAGES/DRESSINGS) ×1
CORD BIP STRL DISP 12FT (MISCELLANEOUS) ×3 IMPLANT
COVER WAND RF STERILE (DRAPES) ×3 IMPLANT
CUFF TOURN SGL QUICK 18X4 (TOURNIQUET CUFF) IMPLANT
FORCEPS JEWEL BIP 4-3/4 STR (INSTRUMENTS) ×3 IMPLANT
GAUZE SPONGE 4X4 12PLY STRL (GAUZE/BANDAGES/DRESSINGS) ×3 IMPLANT
GAUZE XEROFORM 1X8 LF (GAUZE/BANDAGES/DRESSINGS) ×3 IMPLANT
GLOVE BIO SURGEON STRL SZ8 (GLOVE) ×6 IMPLANT
GLOVE INDICATOR 8.0 STRL GRN (GLOVE) ×3 IMPLANT
GOWN STRL REUS W/ TWL LRG LVL3 (GOWN DISPOSABLE) ×1 IMPLANT
GOWN STRL REUS W/ TWL XL LVL3 (GOWN DISPOSABLE) ×1 IMPLANT
GOWN STRL REUS W/TWL LRG LVL3 (GOWN DISPOSABLE) ×2
GOWN STRL REUS W/TWL XL LVL3 (GOWN DISPOSABLE) ×2
KIT TURNOVER KIT A (KITS) ×3 IMPLANT
NDL HYPO 25X1 1.5 SAFETY (NEEDLE) ×1 IMPLANT
NEEDLE HYPO 25X1 1.5 SAFETY (NEEDLE) ×3 IMPLANT
NS IRRIG 500ML POUR BTL (IV SOLUTION) ×3 IMPLANT
PACK EXTREMITY ARMC (MISCELLANEOUS) ×3 IMPLANT
PAD CAST CTTN 4X4 STRL (SOFTGOODS) ×1 IMPLANT
PADDING CAST COTTON 4X4 STRL (SOFTGOODS) ×2
SPLINT WRIST LG LT TX990309 (SOFTGOODS) ×1 IMPLANT
SPLINT WRIST M LT TX990308 (SOFTGOODS) ×3 IMPLANT
SPLINT WRIST M RT TX990303 (SOFTGOODS) ×1 IMPLANT
STOCKINETTE IMPERVIOUS 9X36 MD (GAUZE/BANDAGES/DRESSINGS) ×3 IMPLANT
STRIP CLOSURE SKIN 1/4X4 (GAUZE/BANDAGES/DRESSINGS) ×1 IMPLANT
SUT PROLENE 4 0 PS 2 18 (SUTURE) ×3 IMPLANT
SUT VIC AB 3-0 SH 27 (SUTURE) ×2
SUT VIC AB 3-0 SH 27X BRD (SUTURE) IMPLANT
SWABSTK COMLB BENZOIN TINCTURE (MISCELLANEOUS) ×2 IMPLANT

## 2019-03-19 NOTE — Anesthesia Preprocedure Evaluation (Signed)
Anesthesia Evaluation  Patient identified by MRN, date of birth, ID band Patient awake    Reviewed: Allergy & Precautions, NPO status , Patient's Chart, lab work & pertinent test results  History of Anesthesia Complications (+) PONV and history of anesthetic complications  Airway Mallampati: II       Dental   Pulmonary neg COPD, Not current smoker,           Cardiovascular (-) hypertension(-) Past MI and (-) CHF (-) dysrhythmias (-) Valvular Problems/Murmurs     Neuro/Psych neg Seizures Anxiety    GI/Hepatic Neg liver ROS, neg GERD  ,  Endo/Other  neg diabetesHypothyroidism   Renal/GU negative Renal ROS     Musculoskeletal   Abdominal   Peds  Hematology   Anesthesia Other Findings   Reproductive/Obstetrics                             Anesthesia Physical Anesthesia Plan  ASA: II  Anesthesia Plan: Bier Block and Bier Block-LIDOCAINE ONLY   Post-op Pain Management:    Induction:   PONV Risk Score and Plan: Propofol infusion  Airway Management Planned:   Additional Equipment:   Intra-op Plan:   Post-operative Plan:   Informed Consent: I have reviewed the patients History and Physical, chart, labs and discussed the procedure including the risks, benefits and alternatives for the proposed anesthesia with the patient or authorized representative who has indicated his/her understanding and acceptance.       Plan Discussed with:   Anesthesia Plan Comments:         Anesthesia Quick Evaluation

## 2019-03-19 NOTE — Transfer of Care (Signed)
Immediate Anesthesia Transfer of Care Note  Patient: Sandra Baker  Procedure(s) Performed: EXCISION OF LEFT DORSAL CARPAL GANGLION (Left Wrist)  Patient Location: PACU  Anesthesia Type:General  Level of Consciousness: awake  Airway & Oxygen Therapy: Patient Spontanous Breathing and Patient connected to face mask oxygen  Post-op Assessment: Report given to RN and Post -op Vital signs reviewed and stable  Post vital signs: Reviewed and stable  Last Vitals:  Vitals Value Taken Time  BP 102/84 03/19/19 0952  Temp 36.1 C 03/19/19 0952  Pulse 59 03/19/19 1001  Resp 9 03/19/19 1001  SpO2 100 % 03/19/19 1001  Vitals shown include unvalidated device data.  Last Pain:  Vitals:   03/19/19 0952  TempSrc:   PainSc: 0-No pain         Complications: No apparent anesthesia complications

## 2019-03-19 NOTE — Op Note (Signed)
03/19/2019  9:48 AM  Patient:   Sandra Baker  Pre-Op Diagnosis:   Left dorsal carpal ganglion.  Post-Op Diagnosis:   Same.  Procedure:   Excision of left dorsal carpal ganglion.  Surgeon:   Pascal Lux, MD  Anesthesia:   Bier block  Findings:   As above.  Complications:   None  EBL:   0 cc  Fluids:   700 cc crystalloid  TT:   40 minutes at 250 mmHg  Drains:   None  Closure:   3-0 Vicryl subcuticular sutures  Brief Clinical Note:   The patient is a 59 year old female with a several month history of progressively worsening left wrist pain associated with a soft tissue fullness dorsally. The patient symptoms have persisted despite medications, activity modification, etc. Her history and examination are consistent with a symptomatic dorsal carpal ganglion. The patient presents at this time for excision of the left dorsal carpal ganglion.  Procedure:   The patient was brought into the operating room and lain in the supine position. A timeout was performed to verify the appropriate surgical site. After adequate IV sedation was achieved, a Bier block was placed by the anesthesiologist and the tourniquet inflated to 250 mmHg. The left hand and upper extremity were prepped with ChloraPrep solution before being draped sterilely. Preoperative antibiotics were administered.   After performing a second timeout to verify the appropriate surgical site, an approximately 1.5-2 cm incision was made transversely over the cyst in line with the wrist extension crease. The incision was carried down through the subcutaneous tissues with care taken to identify and protect any neurovascular structures. A portion of the extensor retinaculum was incised to permit further access to the cyst. The tendons were dissected radially and ulnarly in order to dissect down onto the cyst. Once the cyst was identified, it was dissected out circumferentially, tracking it down to the stalk, before it was removed.  During its removal, the cyst ruptured, releasing approximately 1 cc of a clear straw-colored gelatinous material, consistent with a ganglion cyst. The cyst was tracked down to the carpus. A small amount of capsule was removed from the carpus in order to minimize the likelihood of recurrence.  The wound was copiously irrigated with sterile saline solution before the skin was closed using 3-0 Vicryl subcuticular sutures. Benzoin and Steri-Strips were applied to the skin before a sterile bulky dressing was applied to the wrist. A total of 5 cc of 0.5% plain Sensorcaine was injected in and around the incision to help with postoperative analgesia prior to placement of the dressing. The patient's left wrist was placed into a Velcro splint before she was awakened and returned to the recovery room in satisfactory condition after tolerating the procedure well.

## 2019-03-19 NOTE — Anesthesia Postprocedure Evaluation (Signed)
Anesthesia Post Note  Patient: Sandra Baker  Procedure(s) Performed: EXCISION OF LEFT DORSAL CARPAL GANGLION (Left Wrist)  Patient location during evaluation: PACU Anesthesia Type: Bier Block Level of consciousness: awake and alert Pain management: pain level controlled Vital Signs Assessment: post-procedure vital signs reviewed and stable Respiratory status: spontaneous breathing and respiratory function stable Cardiovascular status: stable Anesthetic complications: no     Last Vitals:  Vitals:   03/19/19 1007 03/19/19 1018  BP: 103/73 106/78  Pulse: (!) 56 67  Resp: 17 16  Temp: 36.4 C (!) 36.1 C  SpO2: 99% 100%    Last Pain:  Vitals:   03/19/19 1018  TempSrc: Temporal  PainSc: 0-No pain                 Lema Heinkel K

## 2019-03-19 NOTE — H&P (Signed)
Paper H&P to be scanned into permanent record. H&P reviewed and patient re-examined. No changes. 

## 2019-03-19 NOTE — Discharge Instructions (Addendum)
AMBULATORY SURGERY  DISCHARGE INSTRUCTIONS   1) The drugs that you were given will stay in your system until tomorrow so for the next 24 hours you should not:  A) Drive an automobile B) Make any legal decisions C) Drink any alcoholic beverage   2) You may resume regular meals tomorrow.  Today it is better to start with liquids and gradually work up to solid foods.  You may eat anything you prefer, but it is better to start with liquids, then soup and crackers, and gradually work up to solid foods.   3) Please notify your doctor immediately if you have any unusual bleeding, trouble breathing, redness and pain at the surgery site, drainage, fever, or pain not relieved by medication.    4) Additional Instructions:        Please contact your physician with any problems or Same Day Surgery at [phone], Monday through Friday 6 am to 4 pm, or [place] [facility] The number at [phone].  Orthopedic discharge instructions: Keep dressing dry and intact. Keep hand elevated above heart level. May shower after dressing removed on postop day 4 (Sunday). Reapply Velcro splint after drying off. Apply ice to affected area frequently. Take Aleve 1-2 tabs BID OR ibuprofen 600-800 mg TID with meals for 7-10 days, then as necessary. Take ES Tylenol as prescribed when needed.  Return for follow-up in 10-14 days or as scheduled.

## 2019-03-20 LAB — SURGICAL PATHOLOGY

## 2019-04-25 ENCOUNTER — Ambulatory Visit: Payer: BC Managed Care – PPO | Attending: Internal Medicine

## 2019-04-25 DIAGNOSIS — Z23 Encounter for immunization: Secondary | ICD-10-CM

## 2019-04-25 NOTE — Progress Notes (Signed)
   Covid-19 Vaccination Clinic  Name:  [name]    MRN: [id number] DOB: 07-27-1960  04/25/2019  [name] was observed post Covid-19 immunization for 15 minutes without incident. She was provided with Vaccine Information Sheet and instruction to access the V-Safe system.   [name] was instructed to call 911 with any severe reactions post vaccine: Marland Kitchen Difficulty breathing  . Swelling of face and throat  . A fast heartbeat  . A bad rash all over body  . Dizziness and weakness   Immunizations Administered    Name Date Dose VIS Date Route   Pfizer COVID-19 Vaccine 04/25/2019  4:27 PM 0.3 mL 01/03/2019 Intramuscular   Manufacturer: [place]   Lot: [id number]   [place]: [id number]

## 2019-05-19 ENCOUNTER — Ambulatory Visit: Payer: BC Managed Care – PPO | Attending: Internal Medicine

## 2019-05-19 DIAGNOSIS — Z23 Encounter for immunization: Secondary | ICD-10-CM

## 2019-05-19 NOTE — Progress Notes (Signed)
   Covid-19 Vaccination Clinic  Name:  Sandra Baker    MRN: [id number] DOB: February 08, 1960  05/19/2019  [name] was observed post Covid-19 immunization for 15 minutes without incident. She was provided with Vaccine Information Sheet and instruction to access the V-Safe system.   [name] was instructed to call 911 with any severe reactions post vaccine: Marland Kitchen Difficulty breathing  . Swelling of face and throat  . A fast heartbeat  . A bad rash all over body  . Dizziness and weakness   Immunizations Administered    Name Date Dose VIS Date Route   Pfizer COVID-19 Vaccine 05/19/2019  3:51 PM 0.3 mL 03/19/2018 Intramuscular   Manufacturer: [place]   Lot: [id number]   [place]: [id number]

## 2019-09-10 ENCOUNTER — Ambulatory Visit (INDEPENDENT_AMBULATORY_CARE_PROVIDER_SITE_OTHER): Payer: BC Managed Care – PPO | Admitting: Obstetrics and Gynecology

## 2019-09-10 ENCOUNTER — Other Ambulatory Visit: Payer: Self-pay

## 2019-09-10 ENCOUNTER — Encounter: Payer: Self-pay | Admitting: Obstetrics and Gynecology

## 2019-09-10 VITALS — BP 90/60 | Ht 62.0 in | Wt 112.0 lb

## 2019-09-10 DIAGNOSIS — Z803 Family history of malignant neoplasm of breast: Secondary | ICD-10-CM | POA: Diagnosis not present

## 2019-09-10 DIAGNOSIS — N6310 Unspecified lump in the right breast, unspecified quadrant: Secondary | ICD-10-CM | POA: Diagnosis not present

## 2019-09-10 NOTE — Progress Notes (Signed)
Sandra Body, MD   Chief Complaint  Patient presents with  . Breast Exam    lump under right arm noticed it since yesterday, no pain/tenderness    HPI:      Ms. Sandra Baker is a 59 y.o. G2P2002 whose LMP was No LMP recorded. Patient is postmenopausal., presents today for RT breast mass, noticed yesterday, but unsure how long it's been there. No tenderness, erythema, trauma. No nipple d/c. Last mammo normal 2/21. FH breast cancer in her PGM and 2 pat cousins, genetic testing not done.   Past Medical History:  Diagnosis Date  . Anxiety   . Arthritis   . Chicken pox   . Complication of anesthesia   . Diffuse cystic mastopathy 2013  . Family history of malignant neoplasm of gastrointestinal tract 2013  . Hemorrhage of rectum and anus 2011  . History of lymph node biopsy 2003  . Hypothyroidism   . Internal hemorrhoids with other complication 0347  . PONV (postoperative nausea and vomiting)   . Special screening for malignant neoplasms, colon 2013    Past Surgical History:  Procedure Laterality Date  . BREAST EXCISIONAL BIOPSY Left 1980's    lympnode bx neg  . COLONOSCOPY  2010   done by Dr. Jamal Collin  . GANGLION CYST EXCISION Left 03/19/2019   Procedure: EXCISION OF LEFT DORSAL CARPAL GANGLION;  Surgeon: Corky Mull, MD;  Location: [facility] ORS;  Service: Orthopedics;  Laterality: Left;  . LYMPH NODE BIOPSY  1991    Family History  Problem Relation Age of Onset  . Colon cancer Other        family history of colon cancer  . Cancer Mother 79       colon  . Cancer Maternal Grandfather        colon  . Breast cancer Cousin 55       x 2  . Breast cancer Cousin 44  . Breast cancer Paternal Grandmother     Social History   Socioeconomic History  . Marital status: Married    Spouse name: Not on file  . Number of children: Not on file  . Years of education: Not on file  . Highest education level: Not on file  Occupational History  . Not on file  Tobacco  Use  . Smoking status: Never Smoker  . Smokeless tobacco: Never Used  Vaping Use  . Vaping Use: Never used  Substance and Sexual Activity  . Alcohol use: Yes    Alcohol/week: 0.0 standard drinks    Comment: OCC WINE  . Drug use: No  . Sexual activity: Yes    Birth control/protection: None  Other Topics Concern  . Not on file  Social History Narrative  . Not on file   Social Determinants of Health   Financial Resource Strain:   . Difficulty of Paying Living Expenses:   Food Insecurity:   . Worried About Charity fundraiser in the Last Year:   . Arboriculturist in the Last Year:   Transportation Needs:   . Film/video editor (Medical):   Marland Kitchen Lack of Transportation (Non-Medical):   Physical Activity:   . Days of Exercise per Week:   . Minutes of Exercise per Session:   Stress:   . Feeling of Stress :   Social Connections:   . Frequency of Communication with Friends and Family:   . Frequency of Social Gatherings with Friends and Family:   . Attends Religious  Services:   . Active Member of Clubs or Organizations:   . Attends Archivist Meetings:   Marland Kitchen Marital Status:   Intimate Partner Violence:   . Fear of Current or Ex-Partner:   . Emotionally Abused:   Marland Kitchen Physically Abused:   . Sexually Abused:     Outpatient Medications Prior to Visit  Medication Sig Dispense Refill  . Calcium Carbonate-Vit D-Min (CALTRATE 600+D PLUS PO) Take 1 tablet by mouth daily with lunch.    . levothyroxine (SYNTHROID) 75 MCG tablet Take 75 mcg by mouth daily before breakfast.    . magnesium oxide (MAG-OX) 400 MG tablet Take 400-800 mg by mouth at bedtime.    . naproxen sodium (ALEVE) 220 MG tablet Take 220 mg by mouth daily as needed (pain.).    Marland Kitchen traZODone (DESYREL) 50 MG tablet Take 50 mg by mouth at bedtime.     . Multiple Vitamin (MULTIVITAMIN WITH MINERALS) TABS tablet Take 1 tablet by mouth daily.    . vitamin B-12 (CYANOCOBALAMIN) 1000 MCG tablet Take 1,000 mcg by mouth  daily with lunch.     No facility-administered medications prior to visit.      ROS:  Review of Systems  Constitutional: Negative for fever.  Gastrointestinal: Negative for blood in stool, constipation, diarrhea, nausea and vomiting.  Genitourinary: Negative for dyspareunia, dysuria, flank pain, frequency, hematuria, urgency, vaginal bleeding, vaginal discharge and vaginal pain.  Musculoskeletal: Negative for back pain.  Skin: Negative for rash.   BREAST: mass   OBJECTIVE:   Vitals:  BP 90/60   Ht 5\' 2"  (1.575 m)   Wt 112 lb (50.8 kg)   BMI 20.49 kg/m   Physical Exam Vitals reviewed.  Pulmonary:     Effort: Pulmonary effort is normal.  Chest:     Breasts: Breasts are symmetrical.        Right: No inverted nipple, mass, nipple discharge, skin change or tenderness.        Left: No inverted nipple, mass, nipple discharge, skin change or tenderness.    Musculoskeletal:        General: Normal range of motion.     Cervical back: Normal range of motion.  Skin:    General: Skin is warm and dry.  Neurological:     General: No focal deficit present.     Mental Status: She is alert and oriented to person, place, and time.     Cranial Nerves: No cranial nerve deficit.  Psychiatric:        Mood and Affect: Mood normal.        Behavior: Behavior normal.        Thought Content: Thought content normal.        Judgment: Judgment normal.     Assessment/Plan: Breast mass, right - Plan: US BREAST LTD UNI RIGHT INC AXILLA, MM DIAG BREAST TOMO UNI RIGHT; ROQ breast mass, check dx mammo and u/s. Will f/u with results.   Family history of breast cancer--MyRisk testing discussed and handout given. Pt to consider and f/u prn.     Return if symptoms worsen or fail to improve.  Erven Ramson B. Jeriyah Granlund, PA-C 09/10/2019 3:37 PM

## 2019-09-10 NOTE — Patient Instructions (Signed)
I value your feedback and entrusting us with your care. If you get a  patient survey, I would appreciate you taking the time to let us know about your experience today. Thank you!  As of January 02, 2019, your lab results will be released to your MyChart immediately, before I even have a chance to see them. Please give me time to review them and contact you if there are any abnormalities. Thank you for your patience.  

## 2019-09-23 ENCOUNTER — Ambulatory Visit
Admission: RE | Admit: 2019-09-23 | Discharge: 2019-09-23 | Disposition: A | Discharge status: Home / Self Care | Payer: BC Managed Care – PPO | Source: Ambulatory Visit | Attending: Obstetrics and Gynecology | Admitting: Obstetrics and Gynecology

## 2019-09-23 ENCOUNTER — Other Ambulatory Visit: Payer: Self-pay

## 2019-09-23 ENCOUNTER — Encounter: Payer: Self-pay | Admitting: Obstetrics and Gynecology

## 2019-09-23 DIAGNOSIS — N6310 Unspecified lump in the right breast, unspecified quadrant: Secondary | ICD-10-CM | POA: Diagnosis present

## 2019-10-20 ENCOUNTER — Telehealth: Payer: Self-pay

## 2019-10-20 NOTE — Telephone Encounter (Signed)
Pt calling; is hoping [facility]MS will rx her something for a bladder inf; has appt c [facility] 12/09/19 for annual.  [phone]  Left detailed msg for pt to call c exact sxs; will send msg but she will probably need to be seen.

## 2019-10-22 NOTE — Telephone Encounter (Signed)
Pt states she is feeling better; she has increased water intake and started a probiotic; still has freq; this is the 2nd UTI this year.  Adv could wait it out and see if cont to improve and if not to be seen.  Pt states will also start cranberry juice.

## 2019-12-09 ENCOUNTER — Other Ambulatory Visit (HOSPITAL_COMMUNITY)
Admission: RE | Admit: 2019-12-09 | Discharge: 2019-12-09 | Disposition: A | Discharge status: Home / Self Care | Payer: BC Managed Care – PPO | Source: Ambulatory Visit | Attending: Obstetrics and Gynecology | Admitting: Obstetrics and Gynecology

## 2019-12-09 ENCOUNTER — Encounter: Payer: Self-pay | Admitting: Obstetrics and Gynecology

## 2019-12-09 ENCOUNTER — Other Ambulatory Visit: Payer: Self-pay

## 2019-12-09 ENCOUNTER — Ambulatory Visit (INDEPENDENT_AMBULATORY_CARE_PROVIDER_SITE_OTHER): Payer: BC Managed Care – PPO | Admitting: Obstetrics and Gynecology

## 2019-12-09 VITALS — BP 110/60 | Ht 62.0 in | Wt 113.0 lb

## 2019-12-09 DIAGNOSIS — Z1239 Encounter for other screening for malignant neoplasm of breast: Secondary | ICD-10-CM

## 2019-12-09 DIAGNOSIS — Z124 Encounter for screening for malignant neoplasm of cervix: Secondary | ICD-10-CM | POA: Insufficient documentation

## 2019-12-09 DIAGNOSIS — Z01419 Encounter for gynecological examination (general) (routine) without abnormal findings: Secondary | ICD-10-CM

## 2019-12-09 DIAGNOSIS — Z23 Encounter for immunization: Secondary | ICD-10-CM

## 2019-12-09 NOTE — Patient Instructions (Signed)
[facility] [place] [address]  [facility] [place]  [address] [place] [address]  Phone: [phone]  Next due February 2022

## 2019-12-09 NOTE — Progress Notes (Signed)
Annual, no concerns

## 2019-12-09 NOTE — Progress Notes (Signed)
Gynecology Annual Exam  PCP: Dion Body, MD  Chief Complaint:  Chief Complaint  Patient presents with  . Gynecologic Exam    History of Present Illness:Patient is a 59 y.o. G2P2002 presents for annual exam. The patient has no complaints today.   LMP: No LMP recorded. Patient is postmenopausal. No PMB or gyn concerns. The patient does perform self breast exams.  There is no notable family history of breast or ovarian cancer in her family.  The patient wears seatbelts: yes.   The patient has regular exercise: not asked.    The patient denies current symptoms of depression.     Review of Systems: Review of Systems  Constitutional: Negative for chills and fever.  HENT: Negative for congestion.   Respiratory: Negative for cough and shortness of breath.   Cardiovascular: Negative for chest pain and palpitations.  Gastrointestinal: Negative for abdominal pain, constipation, diarrhea, heartburn, nausea and vomiting.  Genitourinary: Negative for dysuria, frequency and urgency.  Skin: Negative for itching and rash.  Neurological: Negative for dizziness and headaches.  Endo/Heme/Allergies: Negative for polydipsia.  Psychiatric/Behavioral: Negative for depression.    Past Medical History:  Patient Active Problem List   Diagnosis Date Noted  . Family history of breast cancer 09/10/2019  . Joint pain 10/05/2014  . Preventative health care 10/05/2014  . Excessive thirst 10/05/2014  . Hypothyroidism 10/05/2014  . Osteopenia 10/05/2014  . Family history of malignant neoplasm of gastrointestinal tract     Past Surgical History:  Past Surgical History:  Procedure Laterality Date  . BREAST EXCISIONAL BIOPSY Left 1980's    lympnode bx neg  . COLONOSCOPY  2010   done by Dr. Jamal Collin  . GANGLION CYST EXCISION Left 03/19/2019   Procedure: EXCISION OF LEFT DORSAL CARPAL GANGLION;  Surgeon: Corky Mull, MD;  Location: [facility] ORS;  Service: Orthopedics;  Laterality: Left;  .  LYMPH NODE BIOPSY  1991    Gynecologic History:  No LMP recorded. Patient is postmenopausal. Last Pap: Results were: 11/29/2018 NIL and HR HPV negative  Last mammogram: 09/23/2019 Results were: BI-RAD II  Obstetric History: [id number]  Family History:  Family History  Problem Relation Age of Onset  . Colon cancer Other        family history of colon cancer  . Cancer Mother 43       colon  . Cancer Maternal Grandfather        colon  . Breast cancer Cousin 55       x 2  . Breast cancer Cousin 58  . Breast cancer Paternal Grandmother     Social History:  Social History   Socioeconomic History  . Marital status: Married    Spouse name: Not on file  . Number of children: Not on file  . Years of education: Not on file  . Highest education level: Not on file  Occupational History  . Not on file  Tobacco Use  . Smoking status: Never Smoker  . Smokeless tobacco: Never Used  Vaping Use  . Vaping Use: Never used  Substance and Sexual Activity  . Alcohol use: Yes    Alcohol/week: 0.0 standard drinks    Comment: OCC WINE  . Drug use: No  . Sexual activity: Yes    Birth control/protection: None  Other Topics Concern  . Not on file  Social History Narrative  . Not on file   Social Determinants of Health   Financial Resource Strain:   . Difficulty of Paying  Living Expenses: Not on file  Food Insecurity:   . Worried About Charity fundraiser in the Last Year: Not on file  . Ran Out of Food in the Last Year: Not on file  Transportation Needs:   . Lack of Transportation (Medical): Not on file  . Lack of Transportation (Non-Medical): Not on file  Physical Activity:   . Days of Exercise per Week: Not on file  . Minutes of Exercise per Session: Not on file  Stress:   . Feeling of Stress : Not on file  Social Connections:   . Frequency of Communication with Friends and Family: Not on file  . Frequency of Social Gatherings with Friends and Family: Not on file  . Attends  Religious Services: Not on file  . Active Member of Clubs or Organizations: Not on file  . Attends Archivist Meetings: Not on file  . Marital Status: Not on file  Intimate Partner Violence:   . Fear of Current or Ex-Partner: Not on file  . Emotionally Abused: Not on file  . Physically Abused: Not on file  . Sexually Abused: Not on file    Allergies:  No Known Allergies  Medications: Prior to Admission medications   Medication Sig Start Date End Date Taking? Authorizing Provider  Calcium Carbonate-Vit D-Min (CALTRATE 600+D PLUS PO) Take 1 tablet by mouth daily with lunch.   Yes [provider]  levothyroxine (SYNTHROID) 75 MCG tablet Take 75 mcg by mouth daily before breakfast.   Yes [provider]  magnesium oxide (MAG-OX) 400 MG tablet Take 400-800 mg by mouth at bedtime.   Yes [provider]  naproxen sodium (ALEVE) 220 MG tablet Take 220 mg by mouth daily as needed (pain.).   Yes [provider]  traZODone (DESYREL) 50 MG tablet Take 50 mg by mouth at bedtime.  11/08/17 03/06/20 Yes [provider]    Physical Exam Vitals: Blood pressure 110/60, height 5\' 2"  (1.575 m), weight 113 lb (51.3 kg).  General: NAD HEENT: normocephalic, anicteric Thyroid: no enlargement, no palpable nodules Pulmonary: No increased work of breathing, CTAB Cardiovascular: RRR, distal pulses 2+ Breast: Breast symmetrical, no tenderness, no palpable nodules or masses, no skin or nipple retraction present, no nipple discharge.  No axillary or supraclavicular lymphadenopathy. Abdomen: NABS, soft, non-tender, non-distended.  Umbilicus without lesions.  No hepatomegaly, splenomegaly or masses palpable. No evidence of hernia  Genitourinary:  External: Normal external female genitalia.  Normal urethral meatus, normal Bartholin's and Skene's glands.    Vagina: Normal vaginal mucosa, no evidence of prolapse.    Cervix: Grossly normal in appearance, no  bleeding  Uterus: Non-enlarged, mobile, normal contour.  No CMT  Adnexa: ovaries non-enlarged, no adnexal masses  Rectal: deferred  Lymphatic: no evidence of inguinal lymphadenopathy Extremities: no edema, erythema, or tenderness Neurologic: Grossly intact Psychiatric: mood appropriate, affect full  Female chaperone present for pelvic and breast  portions of the physical exam  Immunization History  Administered Date(s) Administered  . Influenza,inj,Quad PF,6+ Mos 12/09/2019  . PFIZER SARS-COV-2 Vaccination 04/25/2019, 05/19/2019     Assessment: 59 y.o. G2P2002 routine annual exam  Plan: Problem List Items Addressed This Visit    None    Visit Diagnoses    Encounter for gynecological examination without abnormal finding    -  Primary   Breast screening       Relevant Orders   MM 3D SCREEN BREAST BILATERAL   Screening for malignant neoplasm of cervix  Relevant Orders   Cytology - PAP   Need for immunization against influenza       Relevant Orders   Flu Vaccine QUAD 36+ mos IM (Completed)      1) Mammogram - recommend yearly screening mammogram.  Mammogram Is up to date  2) STI screening  was notoffered and therefore not obtained  3) ASCCP guidelines and rational discussed.  Patient opts for yearly screening interval  4) Osteoporosis  - per USPTF routine screening DEXA at age 31  5) Routine healthcare maintenance including cholesterol, diabetes screening discussed managed by PCP  6) Colonoscopy UTD 09/05/2018 Dr Alice Reichert  7) Return in about 1 year (around 12/08/2020) for annual.    Malachy Mood, MD [place], [place] 12/09/2019, 8:54 AM

## 2019-12-12 LAB — CYTOLOGY - PAP
Comment: NEGATIVE
Diagnosis: NEGATIVE
High risk HPV: NEGATIVE

## 2020-03-25 ENCOUNTER — Ambulatory Visit
Admission: RE | Admit: 2020-03-25 | Discharge: 2020-03-25 | Disposition: A | Discharge status: Home / Self Care | Payer: BC Managed Care – PPO | Source: Ambulatory Visit | Attending: Obstetrics and Gynecology | Admitting: Obstetrics and Gynecology

## 2020-03-25 ENCOUNTER — Other Ambulatory Visit: Payer: Self-pay

## 2020-03-25 DIAGNOSIS — Z1231 Encounter for screening mammogram for malignant neoplasm of breast: Secondary | ICD-10-CM | POA: Diagnosis not present

## 2020-03-25 DIAGNOSIS — Z1239 Encounter for other screening for malignant neoplasm of breast: Secondary | ICD-10-CM

## 2020-04-05 ENCOUNTER — Other Ambulatory Visit: Payer: Self-pay

## 2020-04-05 ENCOUNTER — Ambulatory Visit: Payer: BC Managed Care – PPO | Admitting: Urology

## 2020-04-05 ENCOUNTER — Encounter: Payer: Self-pay | Admitting: Urology

## 2020-04-05 VITALS — BP 117/70 | HR 67 | Ht 62.0 in | Wt 114.0 lb

## 2020-04-05 DIAGNOSIS — R350 Frequency of micturition: Secondary | ICD-10-CM | POA: Diagnosis not present

## 2020-04-05 DIAGNOSIS — N390 Urinary tract infection, site not specified: Secondary | ICD-10-CM | POA: Diagnosis not present

## 2020-04-05 LAB — URINALYSIS, COMPLETE
Bilirubin, UA: NEGATIVE
Glucose, UA: NEGATIVE
Ketones, UA: NEGATIVE
Nitrite, UA: NEGATIVE
Protein,UA: NEGATIVE
Specific Gravity, UA: 1.015 (ref 1.005–1.030)
Urobilinogen, Ur: 0.2 mg/dL (ref 0.2–1.0)
pH, UA: 7.5 (ref 5.0–7.5)

## 2020-04-05 LAB — MICROSCOPIC EXAMINATION

## 2020-04-05 LAB — BLADDER SCAN AMB NON-IMAGING: Scan Result: 0

## 2020-04-05 MED ORDER — MIRABEGRON ER 50 MG PO TB24: 50.0000 mg | ORAL_TABLET | Freq: Every day | ORAL | 11 refills | Status: AC

## 2020-04-05 NOTE — Progress Notes (Signed)
04/05/2020 8:24 AM   Sandra Baker 02-Jul-1960 [id number]  Referring provider: Dion Body, MD [facility] [place],  [place] [address]  Chief Complaint  Patient presents with  . Recurrent UTI    HPI: I was consulted to assess the patient is urinary frequency worsening over 6 months.  She voids at least every 1 hour.  She could hold it for 2 hours.  She has no nocturia.  She is continent.  She says she voids frequently because she has a hard to describe pressure but again it is quite vague.  She voids with a good flow and feels empty.  Few minutes later she can double void a small amount  Has not had a hysterectomy.  No smoking history.  Symptoms are little bit better now doing pelvic floor exercises.  Describes negative urine cultures by primary care  Patient denies a history of kidney stones bladder surgery urinary tract infections.  No neuro issues and normal bowel movements   PMH: Past Medical History:  Diagnosis Date  . Anxiety   . Arthritis   . Chicken pox   . Complication of anesthesia   . Diffuse cystic mastopathy 2013  . Family history of malignant neoplasm of gastrointestinal tract 2013  . Hemorrhage of rectum and anus 2011  . History of lymph node biopsy 2003  . Hypothyroidism   . Internal hemorrhoids with other complication 4270  . PONV (postoperative nausea and vomiting)   . Special screening for malignant neoplasms, colon 2013    Surgical History: Past Surgical History:  Procedure Laterality Date  . BREAST EXCISIONAL BIOPSY Left 1980's    lympnode bx neg  . COLONOSCOPY  2010   done by Dr. Jamal Collin  . GANGLION CYST EXCISION Left 03/19/2019   Procedure: EXCISION OF LEFT DORSAL CARPAL GANGLION;  Surgeon: Corky Mull, MD;  Location: [facility] ORS;  Service: Orthopedics;  Laterality: Left;  . LYMPH NODE BIOPSY  1991    Home Medications:  Allergies as of 04/05/2020   No Known Allergies     Medication List        Accurate as of April 05, 2020  8:24 AM. If you have any questions, ask your nurse or doctor.        CALTRATE 600+D PLUS PO Take 1 tablet by mouth daily with lunch.   levothyroxine 75 MCG tablet Commonly known as: SYNTHROID Take 75 mcg by mouth daily before breakfast.   magnesium oxide 400 MG tablet Commonly known as: MAG-OX Take 400-800 mg by mouth at bedtime.   naproxen sodium 220 MG tablet Commonly known as: ALEVE Take 220 mg by mouth daily as needed (pain.).   traZODone 50 MG tablet Commonly known as: DESYREL Take 50 mg by mouth at bedtime.       Allergies: No Known Allergies  Family History: Family History  Problem Relation Age of Onset  . Colon cancer Other        family history of colon cancer  . Cancer Mother 94       colon  . Cancer Maternal Grandfather        colon  . Breast cancer Cousin 55       x 2  . Breast cancer Cousin 80  . Breast cancer Paternal Grandmother     Social History:  reports that she has never smoked. She has never used smokeless tobacco. She reports current alcohol use. She reports that she does not use drugs.  ROS:  Physical Exam: There were no vitals taken for this visit.  Constitutional:  Alert and oriented, No acute distress. HEENT: [place] AT, moist mucus membranes.  Trachea midline, no masses. Cardiovascular: No clubbing, cyanosis, or edema. Respiratory: Normal respiratory effort, no increased work of breathing. GI: Abdomen is soft, nontender, nondistended, no abdominal masses GU: No CVA tenderness.  No prolapse.  No stress incontinence.  Mild hypermobility the bladder neck.  No trigger point Skin: No rashes, bruises or suspicious lesions. Lymph: No cervical or inguinal adenopathy. Neurologic: Grossly intact, no focal deficits, moving all 4 extremities. Psychiatric: Normal mood and affect.  Laboratory Data: Lab Results  Component Value Date   WBC 4.2 10/05/2014    HGB 14.0 10/05/2014   HCT 42.1 10/05/2014   MCV 95.6 10/05/2014   PLT 189.0 10/05/2014    Lab Results  Component Value Date   CREATININE 0.82 10/05/2014    No results found for: PSA  No results found for: TESTOSTERONE  No results found for: HGBA1C  Urinalysis No results found for: COLORURINE, APPEARANCEUR, LABSPEC, PHURINE, GLUCOSEU, HGBUR, BILIRUBINUR, KETONESUR, PROTEINUR, UROBILINOGEN, NITRITE, LEUKOCYTESUR  Pertinent Imaging: Urine reviewed.  Chart reviewed.  Urine sent for culture.  I did not see any urine cultures in the chart.  Bladder scan 0 mL  Assessment & Plan: Patient has urinary frequency.  Role of medical and behavioral therapy discussed.  Role of cystoscopy discussed.  I do not think the patient has interstitial cystitis  Myrbetriq 50 mg samples and prescription.  She understands we will try to stop it in the future.  Physical therapy consultation given.  Return in 5 weeks for pelvic examination and cystoscopy  1. Recurrent UTI    No follow-ups on file.  Reece Packer, MD  [facility] [address], [place], [place] [address] [phone]

## 2020-04-08 LAB — CULTURE, URINE COMPREHENSIVE

## 2020-05-04 ENCOUNTER — Other Ambulatory Visit: Payer: Self-pay

## 2020-05-04 ENCOUNTER — Ambulatory Visit: Payer: BC Managed Care – PPO | Attending: Family Medicine

## 2020-05-04 DIAGNOSIS — M62838 Other muscle spasm: Secondary | ICD-10-CM | POA: Diagnosis present

## 2020-05-04 DIAGNOSIS — M533 Sacrococcygeal disorders, not elsewhere classified: Secondary | ICD-10-CM | POA: Insufficient documentation

## 2020-05-04 NOTE — Patient Instructions (Signed)
Child's Pose Pelvic Floor Lengthening    Sit in knee-chest position and reach arms forward. Separate knees for comfort. Hold position for _5__ breaths. Repeat _2-3__ times. Do _1-2__ times per day.   Hold for 5 deep breaths, repeat 2-3 times, once daily.

## 2020-05-04 NOTE — Therapy (Signed)
[place] MAIN [facility] SERVICES [address] [place], [place], [address] Phone: [phone]   Fax:  [phone]  Physical Therapy Evaluation  The patient has been informed of current processes in place at Outpatient Rehab to protect patients from Covid-19 exposure including social distancing, schedule modifications, and new cleaning procedures. After discussing their particular risk with a therapist based on the patient's personal risk factors, the patient has decided to proceed with in-person therapy.  Patient Details  Name: Sandra Baker MRN: [id number] Date of Birth: 1960/11/20 No data recorded  Encounter Date: 05/04/2020   PT End of Session - 05/04/20 0741    Visit Number 1    Number of Visits 12    Date for PT Re-Evaluation 07/27/20    Authorization - Visit Number 1    Authorization - Number of Visits 12    Progress Note Due on Visit 10    PT Start Time 0738    PT Stop Time 0831    PT Time Calculation (min) 53 min    Activity Tolerance Patient tolerated treatment well;No increased pain    Behavior During Therapy WFL for tasks assessed/performed           Past Medical History:  Diagnosis Date  . Anxiety   . Arthritis   . Chicken pox   . Complication of anesthesia   . Diffuse cystic mastopathy 2013  . Dysplastic nevus 07/20/2009   L mid lat bicep - mild   . Family history of malignant neoplasm of gastrointestinal tract 2013  . Hemorrhage of rectum and anus 2011  . History of lymph node biopsy 2003  . Hypothyroidism   . Internal hemorrhoids with other complication 7588  . PONV (postoperative nausea and vomiting)   . Special screening for malignant neoplasms, colon 2013    Past Surgical History:  Procedure Laterality Date  . BREAST EXCISIONAL BIOPSY Left 1980's    lympnode bx neg  . COLONOSCOPY  2010   done by Dr. Jamal Collin  . GANGLION CYST EXCISION Left 03/19/2019   Procedure: EXCISION OF LEFT DORSAL CARPAL GANGLION;   Surgeon: Corky Mull, MD;  Location: [facility] ORS;  Service: Orthopedics;  Laterality: Left;  . LYMPH NODE BIOPSY  1991    There were no vitals filed for this visit.  Pelvic Floor Physical Therapy Evaluation and Assessment  SUBJECTIVE  Patient reports: She drinks a lot of water so she is used to urinating frequently but it got to the point where she felt like she was not emptying completely and it became painful (UTI sensation). She voids ~ every hour before starting medicine. She has started taking Myrbetriq in March and it is helping but she would like to be able to come off of it if possible.  Stress levels are normal. She was doing a Pilates class pre-covid but had to stop because of Covid.  Precautions:  Anxiety, hypothyroidism, Internal hemorrhoids, osteopenia, Arthritis in her R hand.    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  Recent Procedures/Tests/Findings:  none  Obstetrical History: 2 vaginal deliveries, tearing with the first  Gynecological History: none  Urinary History: She started having frequency a couple months ago for no apparent reason. She was voiding ~ every hour before starting Myrbetriq.   Gastrointestinal History: No issues  Sexual activity/pain: none  Location of pain: none  Patient Goals: To not have discomfort and feel like she is able to empty her bladder fully.  OBJECTIVE  Posture/Observations:  Sitting:  Standing: R handed, _0 , R foot slightly ER'd, slight hyperkyphosis/lordosis, R ASIS low, R PSIS high   Palpation/Segmental Motion/Joint Play: TTP to B iliacus, Piriformis and Glute max, R adductor brevis, L>R HS. Decreased sacral mobility and pain with pressure at sacral base. Decreased mobility through thoracic spine. TTP to C2 and TTP to L>R cervical extensors.  Special tests:   Scoliosis: negative Supine-to-long-sit: RLE long in supine, even in seated.  Range of  Motion/Flexibilty:  Spine: WNL Hips:    Strength/MMT: deferred to follow-up LE MMT  LE MMT Left Right  Hip flex:  (L2) /5 /5  Hip ext: /5 /5  Hip abd: /5 /5  Hip add: /5 /5  Hip IR /5 /5  Hip ER /5 /5     Abdominal:  Palpation: TTP to R>L iliacus, tight w/o pain at R rectus origin. Diastasis: none noted.  Pelvic Floor External Exam: deferred to follow-up Introitus Appears:  Skin integrity:  Palpation: Cough: Prolapse visible?: Scar mobility:  Internal Vaginal Exam: Strength (PERF):  Symmetry: Palpation: Prolapse:   Internal Rectal Exam: Strength (PERF): Symmetry: Palpation: Prolapse:   Gait Analysis: WNL   Pelvic Floor Outcome Measures: FOTO PFDI Urinary: 0, Urinary Problem: 98   INTERVENTIONS THIS SESSION: Self-care: Educated on the structure and function of the pelvic floor in relation to their symptoms as well as the POC, and initial HEP in order to set patient expectations and understanding from which we will build on in the future sessions.  Therex: Educated on using Child's pose and/or happy baby to help lengthen the PFM and decrease urgency.  Total time: 53 min                    Objective measurements completed on examination: See above findings.                 PT Short Term Goals - 05/04/20 1223      PT SHORT TERM GOAL #1   Title Patient will demonstrate improved pelvic alignment and balance of musculature surrounding the pelvis to facilitate decreased PFM spasms and decrease Urinary frequency/allow for improved emptying.    Baseline R anterior rotation, spasms surrounding R>L hip, decreased sacral mobility.    Time 6    Period Weeks    Status New    Target Date 06/15/20      PT SHORT TERM GOAL #2   Title Patient will demonstrate HEP x1 in the clinic to demonstrate understanding and proper form to allow for further improvement.    Baseline Pt. lacks knowledge of which therapeutic exercises will help  decrease her Sx.    Time 6    Period Weeks    Status New    Target Date 06/15/20             PT Long Term Goals - 05/04/20 1225      PT LONG TERM GOAL #1   Title Patient will report urinating 6-8 times without medication per day over the course of the prior week to demonstrate decreased frequency.    Baseline Pt. urinating ~ every hour due to discomfort before starting myrbetriq    Time 12    Period Weeks    Status New    Target Date 07/27/20      PT LONG TERM GOAL #2   Title Pt. will report feeling like she is able to fully empty the bladder to demonstrate improved function and QOL and help  prevent UTI's.    Baseline Pt. reports not feeling like she fully empties when urinating and discomfort with bladder filling.    Time 12    Period Weeks    Status New    Target Date 07/27/20      PT LONG TERM GOAL #3   Title Pt. will improve in FOTO score to maximal score in both categories to demonstrate improved function.    Baseline FOTO PFDI Urinary: 0, Urinary Problem: 98    Time 12    Period Weeks    Status New    Target Date 07/27/20                  Plan - 05/04/20 1214    Clinical Impression Statement Pt. is a 60 y/o female who presents today with cheif c/o incomplete emptying and urinary frequency. Her PMH is significant for Anxiety, hypothyroidism, Internal hemorrhoids, osteopenia, and OA in her R hand. Her Clinical assessment revealed a R anterior rotation, decreased spinal and sacral mobility, Spasms surrounding and R>L hip with a history that suggests likely PFM spasm and decreased scar mobility from perineal tear during childbirth. She will benefit from skilled pelvic health PT to address the noted deficits and to continue to assess for and address any other potential causes of Sx.    Personal Factors and Comorbidities Comorbidity 2    Comorbidities Anxiety, osteopenia    Examination-Activity Limitations Toileting    Examination-Participation Restrictions  Community Activity    Stability/Clinical Decision Making Evolving/Moderate complexity    Clinical Decision Making Moderate    Rehab Potential Excellent    PT Frequency 1x / week    PT Duration 12 weeks    PT Treatment/Interventions ADLs/Self Care Home Management;Biofeedback;Electrical Stimulation;Moist Heat;Ultrasound;Gait training;Stair training;Functional mobility training;Therapeutic activities;Neuromuscular re-education;Therapeutic exercise;Patient/family education;Orthotic Fit/Training;Manual techniques;Scar mobilization;Passive range of motion;Dry needling;Taping;Spinal Manipulations;Joint Manipulations    PT Next Visit Plan TP release Vs. TPDN to R hip-flexors, R anterior rotation MET correction, R adductor brevis release, spinal and sacral mobility. release R rectus PRN. Try e-stim PRN.    PT Home Exercise Plan Happy baby/child's pose    Consulted and Agree with Plan of Care Patient           Patient will benefit from skilled therapeutic intervention in order to improve the following deficits and impairments:  Increased fascial restricitons,Pain,Postural dysfunction,Hypomobility,Decreased scar mobility,Increased muscle spasms  Visit Diagnosis: Sacrococcygeal disorders, not elsewhere classified  Other muscle spasm     Problem List Patient Active Problem List   Diagnosis Date Noted  . Family history of breast cancer 09/10/2019  . Joint pain 10/05/2014  . Preventative health care 10/05/2014  . Excessive thirst 10/05/2014  . Hypothyroidism 10/05/2014  . Osteopenia 10/05/2014  . Family history of malignant neoplasm of gastrointestinal tract    Willa Rough DPT, ATC Willa Rough 05/04/2020, 12:36 PM  [place] MAIN [facility] SERVICES [address] [place], [place], [address] Phone: [phone]   Fax:  [phone]  Name: KRYSTIANNA SOTH MRN: [id number] Date of Birth: 27-Dec-1960

## 2020-05-10 ENCOUNTER — Other Ambulatory Visit: Payer: Self-pay

## 2020-05-10 ENCOUNTER — Other Ambulatory Visit: Payer: Self-pay | Admitting: Urology

## 2020-05-10 ENCOUNTER — Ambulatory Visit: Payer: BC Managed Care – PPO

## 2020-05-10 DIAGNOSIS — M533 Sacrococcygeal disorders, not elsewhere classified: Secondary | ICD-10-CM | POA: Diagnosis not present

## 2020-05-10 DIAGNOSIS — M62838 Other muscle spasm: Secondary | ICD-10-CM

## 2020-05-10 NOTE — Addendum Note (Signed)
Addended by: Letitia Libra T on: 05/10/2020 04:01 PM   Modules accepted: Orders

## 2020-05-10 NOTE — Therapy (Signed)
[place] MAIN [facility] SERVICES [address] [place], [place], [address] Phone: [phone]   Fax:  [phone]  Physical Therapy Treatment  The patient has been informed of current processes in place at Outpatient Rehab to protect patients from Covid-19 exposure including social distancing, schedule modifications, and new cleaning procedures. After discussing their particular risk with a therapist based on the patient's personal risk factors, the patient has decided to proceed with in-person therapy.   Patient Details  Name: Sandra Baker MRN: [id number] Date of Birth: 1960/06/09 No data recorded  Encounter Date: 05/10/2020   PT End of Session - 05/10/20 0847    Visit Number 2    Number of Visits 12    Date for PT Re-Evaluation 07/27/20    Authorization - Visit Number 2    Authorization - Number of Visits 12    Progress Note Due on Visit 10    PT Start Time 7253    PT Stop Time 0835    PT Time Calculation (min) 60 min    Activity Tolerance Patient tolerated treatment well;No increased pain    Behavior During Therapy WFL for tasks assessed/performed           Past Medical History:  Diagnosis Date  . Anxiety   . Arthritis   . Chicken pox   . Complication of anesthesia   . Diffuse cystic mastopathy 2013  . Dysplastic nevus 07/20/2009   L mid lat bicep - mild   . Family history of malignant neoplasm of gastrointestinal tract 2013  . Hemorrhage of rectum and anus 2011  . History of lymph node biopsy 2003  . Hypothyroidism   . Internal hemorrhoids with other complication 6644  . PONV (postoperative nausea and vomiting)   . Special screening for malignant neoplasms, colon 2013    Past Surgical History:  Procedure Laterality Date  . BREAST EXCISIONAL BIOPSY Left 1980's    lympnode bx neg  . COLONOSCOPY  2010   done by Dr. Jamal Collin  . GANGLION CYST EXCISION Left 03/19/2019   Procedure: EXCISION OF LEFT DORSAL CARPAL GANGLION;   Surgeon: Corky Mull, MD;  Location: [facility] ORS;  Service: Orthopedics;  Laterality: Left;  . LYMPH NODE BIOPSY  1991    There were no vitals filed for this visit.   Pelvic Floor Physical Therapy Treatment Note  SCREENING  Changes in medications, allergies, or medical history?: none    SUBJECTIVE  Patient reports: She is still having times where she is not emptying completely but it is not as painful as it was.  Precautions:  Anxiety, hypothyroidism, Internal hemorrhoids, osteopenia, Arthritis in her R hand.    Pain update: She is not having pain but does get a pressure sensation when she needs to urinate ~ every hour. She is drinking ~ 128 Oz. Of water per day.  Patient Goals: To not have discomfort and feel like she is able to empty her bladder fully.     OBJECTIVE  Changes in: Posture/Observations:  R anterior rotation.  ** following treatment ASIS and PSIS appear level in standing and supine.  Range of Motion/Flexibilty:    Strength/MMT:  LE MMT:  Pelvic floor:  Abdominal:   Palpation: TTP to R Psoas, iliacus and adductor brevis.  Gait Analysis:  INTERVENTIONS THIS SESSION: Manual: Performed TP release and STM to R iliacus to decrease spasm and pain and allow for improved balance of musculature for improved function and decreased symptoms.  Dry-needle: Performed TPDN  with a .30x97mm needle and standard approach as described below to decrease spasm and pain and allow for improved balance of musculature for improved function and decreased symptoms.  Therex: educated on and practiced kneeling hip-flexor stretch with emphasis on posterior pelvic tilt To maintain and improve muscle length and allow for improved balance of musculature for long-term symptom relief.  Theract: Educated on and practiced finding pelvic neutral in sitting with external support to allow for improved posture when working from home and decrease pressure on the nerve roots that lead to  the bladder.  Total time: 60 min.                       Trigger Point Dry Needling - 05/10/20 0001    Consent Given? Yes    Education Handout Provided No    Muscles Treated Back/Hip Iliacus    Dry Needling Comments Right                  PT Short Term Goals - 05/04/20 1223      PT SHORT TERM GOAL #1   Title Patient will demonstrate improved pelvic alignment and balance of musculature surrounding the pelvis to facilitate decreased PFM spasms and decrease Urinary frequency/allow for improved emptying.    Baseline R anterior rotation, spasms surrounding R>L hip, decreased sacral mobility.    Time 6    Period Weeks    Status New    Target Date 06/15/20      PT SHORT TERM GOAL #2   Title Patient will demonstrate HEP x1 in the clinic to demonstrate understanding and proper form to allow for further improvement.    Baseline Pt. lacks knowledge of which therapeutic exercises will help decrease her Sx.    Time 6    Period Weeks    Status New    Target Date 06/15/20             PT Long Term Goals - 05/04/20 1225      PT LONG TERM GOAL #1   Title Patient will report urinating 6-8 times without medication per day over the course of the prior week to demonstrate decreased frequency.    Baseline Pt. urinating ~ every hour due to discomfort before starting myrbetriq    Time 12    Period Weeks    Status New    Target Date 07/27/20      PT LONG TERM GOAL #2   Title Pt. will report feeling like she is able to fully empty the bladder to demonstrate improved function and QOL and help prevent UTI's.    Baseline Pt. reports not feeling like she fully empties when urinating and discomfort with bladder filling.    Time 12    Period Weeks    Status New    Target Date 07/27/20      PT LONG TERM GOAL #3   Title Pt. will improve in FOTO score to maximal score in both categories to demonstrate improved function.    Baseline FOTO PFDI Urinary: 0, Urinary Problem:  98    Time 12    Period Weeks    Status New    Target Date 07/27/20                 Plan - 05/10/20 0847    Clinical Impression Statement Pt. Responded well to all interventions today, demonstrating improved pelvic alignment and decreased spasm/TTP, as well as understanding and correct performance of all education  and exercises provided today. They will continue to benefit from skilled physical therapy to work toward remaining goals and maximize function as well as decrease likelihood of symptom increase or recurrence.    PT Next Visit Plan TP release Vs. TPDN to R adductor brevis, spinal and sacral mobility. release R rectus PRN. Try e-stim PRN.    PT Home Exercise Plan Happy baby/child's pose, kneeling hip-flexor pose    Consulted and Agree with Plan of Care Patient           Patient will benefit from skilled therapeutic intervention in order to improve the following deficits and impairments:     Visit Diagnosis: Sacrococcygeal disorders, not elsewhere classified  Other muscle spasm     Problem List Patient Active Problem List   Diagnosis Date Noted  . Family history of breast cancer 09/10/2019  . Joint pain 10/05/2014  . Preventative health care 10/05/2014  . Excessive thirst 10/05/2014  . Hypothyroidism 10/05/2014  . Osteopenia 10/05/2014  . Family history of malignant neoplasm of gastrointestinal tract    Willa Rough DPT, ATC Willa Rough 05/10/2020, 8:49 AM  [place] MAIN [facility] SERVICES [place], [place], [address] Phone: [phone]   Fax:  [phone]  Name: CALLY NYGARD MRN: [id number] Date of Birth: 10-28-1960

## 2020-05-10 NOTE — Patient Instructions (Addendum)
  When seated, you want to maintain pelvic neutral with the shoulders gently down and back and ears in line with your shoulders. A lumbar roll such as the one below or a home-made towel-roll can be used for this purpose. Even Olympic athletes can only maintain proper seated posture for about 10 minutes without support!    Pictured: The Original [employer] Lumbar Roll   Flexors, Lunge  Hip Flexor Stretch: Proposal Pose    Maintain pelvic tuck under, lift pubic bone toward navel. Engage posterior hip muscles (firm glute muscles of leg in back position) and shift forward until you feel stretch on front of leg that is down. To increase stretch, maintain balance and ease hips forward. You may use one hand on a chair for balance if needed. Hold for __5__ breaths. Repeat __2-3__ times each leg.  Do _1-2__ times per day.

## 2020-05-17 ENCOUNTER — Ambulatory Visit: Payer: BC Managed Care – PPO

## 2020-05-17 ENCOUNTER — Other Ambulatory Visit: Payer: Self-pay

## 2020-05-17 DIAGNOSIS — M62838 Other muscle spasm: Secondary | ICD-10-CM

## 2020-05-17 DIAGNOSIS — M533 Sacrococcygeal disorders, not elsewhere classified: Secondary | ICD-10-CM | POA: Diagnosis not present

## 2020-05-17 NOTE — Therapy (Signed)
[place] MAIN [facility] SERVICES [address] [place], [place], [address] Phone: [phone]   Fax:  [phone]  Physical Therapy Treatment  The patient has been informed of current processes in place at Outpatient Rehab to protect patients from Covid-19 exposure including social distancing, schedule modifications, and new cleaning procedures. After discussing their particular risk with a therapist based on the patient's personal risk factors, the patient has decided to proceed with in-person therapy.  Patient Details  Name: Sandra Baker MRN: [id number] Date of Birth: 1960/11/17 No data recorded  Encounter Date: 05/17/2020   PT End of Session - 05/17/20 1621    Visit Number 3    Number of Visits 12    Date for PT Re-Evaluation 07/27/20    Authorization - Visit Number 3    Authorization - Number of Visits 12    Progress Note Due on Visit 10    PT Start Time 0947    PT Stop Time 0835    PT Time Calculation (min) 58 min    Activity Tolerance Patient tolerated treatment well;No increased pain    Behavior During Therapy WFL for tasks assessed/performed           Past Medical History:  Diagnosis Date  . Anxiety   . Arthritis   . Chicken pox   . Complication of anesthesia   . Diffuse cystic mastopathy 2013  . Dysplastic nevus 07/20/2009   L mid lat bicep - mild   . Family history of malignant neoplasm of gastrointestinal tract 2013  . Hemorrhage of rectum and anus 2011  . History of lymph node biopsy 2003  . Hypothyroidism   . Internal hemorrhoids with other complication 0962  . PONV (postoperative nausea and vomiting)   . Special screening for malignant neoplasms, colon 2013    Past Surgical History:  Procedure Laterality Date  . BREAST EXCISIONAL BIOPSY Left 1980's    lympnode bx neg  . COLONOSCOPY  2010   done by Dr. Jamal Collin  . GANGLION CYST EXCISION Left 03/19/2019   Procedure: EXCISION OF LEFT DORSAL CARPAL GANGLION;   Surgeon: Corky Mull, MD;  Location: [facility] ORS;  Service: Orthopedics;  Laterality: Left;  . LYMPH NODE BIOPSY  1991    There were no vitals filed for this visit.    Pelvic Floor Physical Therapy Treatment Note  SCREENING  Changes in medications, allergies, or medical history?: none    SUBJECTIVE  Patient reports: She is feeling pretty good, not feeling like she has to go again right away after urinating as much. She is urinating every 2-3 hours. Still feeling like she has a hard time emptying fully.  Precautions:  Anxiety, hypothyroidism, Internal hemorrhoids, osteopenia, Arthritis in her R hand.    Pain update: She is not having pain.  ** mild soreness in R hip following TPDN today.  Patient Goals: To not have discomfort and feel like she is able to empty her bladder fully.     OBJECTIVE  Changes in: Posture/Observations:  R anterior rotation. ** following treatment ASIS and PSIS appear level in standing and supine.  TODAY: Slight R anterior rotation.  Range of Motion/Flexibilty:    Strength/MMT:  LE MMT:  Pelvic floor:  Abdominal:   Palpation: TTP to B adductor longus and R adductor brevis.  Gait Analysis:  INTERVENTIONS THIS SESSION: Manual: Performed TP release and STM to B adductor longus and R adductor Brevis to decrease spasm and pain and allow for improved balance  of musculature for improved function and decreased symptoms.  Dry-needle: Performed TPDN with a .30x21m needle and standard approach as described below to decrease spasm and pain and allow for improved balance of musculature for improved function and decreased symptoms.  Therex: educated on self MET correction for R anterior rotation, butterfly stretch, and standing hip EXT and ABD to improve pelvic alignment and use reciprocal inhibition to improve balance of musculature surrounding the pelvis to take pressure off of lumbar nerve roots and decrease Sx.  Total time: 60  min.                               PT Short Term Goals - 05/04/20 1223      PT SHORT TERM GOAL #1   Title Patient will demonstrate improved pelvic alignment and balance of musculature surrounding the pelvis to facilitate decreased PFM spasms and decrease Urinary frequency/allow for improved emptying.    Baseline R anterior rotation, spasms surrounding R>L hip, decreased sacral mobility.    Time 6    Period Weeks    Status New    Target Date 06/15/20      PT SHORT TERM GOAL #2   Title Patient will demonstrate HEP x1 in the clinic to demonstrate understanding and proper form to allow for further improvement.    Baseline Pt. lacks knowledge of which therapeutic exercises will help decrease her Sx.    Time 6    Period Weeks    Status New    Target Date 06/15/20             PT Long Term Goals - 05/04/20 1225      PT LONG TERM GOAL #1   Title Patient will report urinating 6-8 times without medication per day over the course of the prior week to demonstrate decreased frequency.    Baseline Pt. urinating ~ every hour due to discomfort before starting myrbetriq    Time 12    Period Weeks    Status New    Target Date 07/27/20      PT LONG TERM GOAL #2   Title Pt. will report feeling like she is able to fully empty the bladder to demonstrate improved function and QOL and help prevent UTI's.    Baseline Pt. reports not feeling like she fully empties when urinating and discomfort with bladder filling.    Time 12    Period Weeks    Status New    Target Date 07/27/20      PT LONG TERM GOAL #3   Title Pt. will improve in FOTO score to maximal score in both categories to demonstrate improved function.    Baseline FOTO PFDI Urinary: 0, Urinary Problem: 98    Time 12    Period Weeks    Status New    Target Date 07/27/20                 Plan - 05/17/20 1621    Clinical Impression Statement Pt. Responded well to all interventions today,  demonstrating decreased spasm ad pain, as well as understanding and correct performance of all education and exercises provided today. They will continue to benefit from skilled physical therapy to work toward remaining goals and maximize function as well as decrease likelihood of symptom increase or recurrence.   PT Next Visit Plan Assess PFM and address, spinal and sacral mobility. release R rectus PRN. Try e-stim PRN.  PT Home Exercise Plan Happy baby/child's pose, kneeling hip-flexor pose    Consulted and Agree with Plan of Care Patient           Patient will benefit from skilled therapeutic intervention in order to improve the following deficits and impairments:     Visit Diagnosis: Sacrococcygeal disorders, not elsewhere classified  Other muscle spasm     Problem List Patient Active Problem List   Diagnosis Date Noted  . Family history of breast cancer 09/10/2019  . Joint pain 10/05/2014  . Preventative health care 10/05/2014  . Excessive thirst 10/05/2014  . Hypothyroidism 10/05/2014  . Osteopenia 10/05/2014  . Family history of malignant neoplasm of gastrointestinal tract    Willa Rough DPT, ATC Willa Rough 05/17/2020, 4:26 PM  [place] MAIN [facility] SERVICES [address] [place], [place], [address] Phone: [phone]   Fax:  [phone]  Name: Sandra Baker MRN: [id number] Date of Birth: July 18, 1960

## 2020-05-17 NOTE — Patient Instructions (Addendum)
   Bring feet together and let the knees fall out to the sides. Hold for 5 deep breaths, rest then repeat 2 more times.   Hip Abduction (Standing)    Stand with support. Squeeze deep core and hold. Start with leg to the side/back on your toe and gently squeeze the outer hip to lift the toe off of the ground. Hold for 1 second then repeat. You should feel this in the outer hip, not the front, not your side,  And not your back. Repeat __10_ times. Do _3__ sets  a day.  HIP Extension - Standing    Stand with support. Squeeze deep core and hold. Start with leg to the back on your toe and gently squeeze the buttock to lift the toe off of the ground. Hold for 1 second then repeat. You should feel this in the outer hip, not the front, not your side,  And not your back. Repeat __10_ times. Do _3__ sets  a day.   Pelvic Rotation: Contract / Relax (Supine)  Pelvic Rotation: Contract / Relax (Supine)  MET to Correct Right Anteriorly Rotated/Left Posteriorly Rotated Innominate   Begin laying on your back with your feet at 90 degrees. Put a dowel/broomstick  through your legs, behind your right knee and in front of your left knee. Stabilize the dowel on ether side with your hands.  Press down with the right leg and up with the left leg. Hold for 5 seconds  then slowly relax. Repeat 5 times.

## 2020-05-24 ENCOUNTER — Other Ambulatory Visit: Payer: Self-pay

## 2020-05-24 ENCOUNTER — Ambulatory Visit: Payer: BC Managed Care – PPO | Attending: Urology

## 2020-05-24 DIAGNOSIS — M62838 Other muscle spasm: Secondary | ICD-10-CM | POA: Insufficient documentation

## 2020-05-24 DIAGNOSIS — M533 Sacrococcygeal disorders, not elsewhere classified: Secondary | ICD-10-CM | POA: Insufficient documentation

## 2020-05-24 NOTE — Therapy (Signed)
[place] MAIN [facility] SERVICES [address] [place], [place], [address] Phone: [phone]   Fax:  [phone]  Physical Therapy Treatment  The patient has been informed of current processes in place at Outpatient Rehab to protect patients from Covid-19 exposure including social distancing, schedule modifications, and new cleaning procedures. After discussing their particular risk with a therapist based on the patient's personal risk factors, the patient has decided to proceed with in-person therapy.  Patient Details  Name: Sandra Baker MRN: [id number] Date of Birth: 1960-03-30 No data recorded  Encounter Date: 05/24/2020   PT End of Session - 05/24/20 0843    Visit Number 4    Number of Visits 12    Date for PT Re-Evaluation 07/27/20    Authorization - Visit Number 4    Authorization - Number of Visits 12    Progress Note Due on Visit 10    PT Start Time 3557    PT Stop Time 3220    PT Time Calculation (min) 60 min    Activity Tolerance Patient tolerated treatment well;No increased pain    Behavior During Therapy WFL for tasks assessed/performed           Past Medical History:  Diagnosis Date  . Anxiety   . Arthritis   . Chicken pox   . Complication of anesthesia   . Diffuse cystic mastopathy 2013  . Dysplastic nevus 07/20/2009   L mid lat bicep - mild   . Family history of malignant neoplasm of gastrointestinal tract 2013  . Hemorrhage of rectum and anus 2011  . History of lymph node biopsy 2003  . Hypothyroidism   . Internal hemorrhoids with other complication 2542  . PONV (postoperative nausea and vomiting)   . Special screening for malignant neoplasms, colon 2013    Past Surgical History:  Procedure Laterality Date  . BREAST EXCISIONAL BIOPSY Left 1980's    lympnode bx neg  . COLONOSCOPY  2010   done by Dr. Jamal Collin  . GANGLION CYST EXCISION Left 03/19/2019   Procedure: EXCISION OF LEFT DORSAL CARPAL GANGLION;   Surgeon: Corky Mull, MD;  Location: [facility] ORS;  Service: Orthopedics;  Laterality: Left;  . LYMPH NODE BIOPSY  1991    There were no vitals filed for this visit.   Pelvic Floor Physical Therapy Treatment Note  SCREENING  Changes in medications, allergies, or medical history?: none    SUBJECTIVE  Patient reports: She is doing well.she is feeling like she is able to implement the strategies we have talked about in her yoga classes. She is not 100% but can tell she is getting better.  Precautions:  Anxiety, hypothyroidism, Internal hemorrhoids, osteopenia, Arthritis in her R hand.    Pain update: She is not having pain.  ** mild soreness in R hip following TPDN today.  Patient Goals: To not have discomfort and feel like she is able to empty her bladder fully.     OBJECTIVE  Changes in: Posture/Observations:  R anterior rotation. ** following treatment ASIS and PSIS appear level in standing and supine.  05/17/20: Slight R anterior rotation.  TODAY: not re-assessed  Range of Motion/Flexibilty:    Strength/MMT:  LE MMT:   Pelvic Floor External Exam: Introitus Appears: WNL Skin integrity: WNL Palpation: TTP to B IC Cough: paradoxical Prolapse visible?: no Scar mobility: decreased   Internal Vaginal Exam: Strength (PERF): 5/5, 10 sec. Symmetry: TTP to anterior>posterior Palpation: TTP throughout Prolapse: anterior wall visible at  the level of the introitus.   Abdominal:   Palpation: TTP to B adductor longus and R adductor brevis.  Gait Analysis:  INTERVENTIONS THIS SESSION: Manual: Assessed pelvic floor and performed TP release internally to anterior PR/PC B with full release on R and partial release on L to decrease spasm and pain and allow for improved balance of musculature for improved function and decreased symptoms.  Theract: Educated on tools used for self internal TP release as well as how to use soda-can theory to minimize impact of POP and  prevent worsening over time. Educated on how to pay attention to the balance of musculature surrounding the pelvis to help prevent return of spasms and dysfunction.  Total time: 60 min.                              PT Short Term Goals - 05/04/20 1223      PT SHORT TERM GOAL #1   Title Patient will demonstrate improved pelvic alignment and balance of musculature surrounding the pelvis to facilitate decreased PFM spasms and decrease Urinary frequency/allow for improved emptying.    Baseline R anterior rotation, spasms surrounding R>L hip, decreased sacral mobility.    Time 6    Period Weeks    Status New    Target Date 06/15/20      PT SHORT TERM GOAL #2   Title Patient will demonstrate HEP x1 in the clinic to demonstrate understanding and proper form to allow for further improvement.    Baseline Pt. lacks knowledge of which therapeutic exercises will help decrease her Sx.    Time 6    Period Weeks    Status New    Target Date 06/15/20             PT Long Term Goals - 05/04/20 1225      PT LONG TERM GOAL #1   Title Patient will report urinating 6-8 times without medication per day over the course of the prior week to demonstrate decreased frequency.    Baseline Pt. urinating ~ every hour due to discomfort before starting myrbetriq    Time 12    Period Weeks    Status New    Target Date 07/27/20      PT LONG TERM GOAL #2   Title Pt. will report feeling like she is able to fully empty the bladder to demonstrate improved function and QOL and help prevent UTI's.    Baseline Pt. reports not feeling like she fully empties when urinating and discomfort with bladder filling.    Time 12    Period Weeks    Status New    Target Date 07/27/20      PT LONG TERM GOAL #3   Title Pt. will improve in FOTO score to maximal score in both categories to demonstrate improved function.    Baseline FOTO PFDI Urinary: 0, Urinary Problem: 98    Time 12    Period Weeks     Status New    Target Date 07/27/20                 Plan - 05/24/20 0843    Clinical Impression Statement Pt. Responded well to all interventions today, demonstrating decreased spasm and TTP to B anterior PR/PC as well as understanding and correct performance of all education and exercises provided today. They will continue to benefit from skilled physical therapy to work toward remaining goals  and maximize function as well as decrease likelihood of symptom increase or recurrence.    PT Next Visit Plan re- Assess PFM, teach self tool use and address remaining spasms, spinal and sacral mobility prn. give self external TP release handout, release R rectus PRN. Try e-stim PRN.    PT Home Exercise Plan Happy baby/child's pose, kneeling hip-flexor pose, soda-can theory, tools    Consulted and Agree with Plan of Care Patient           Patient will benefit from skilled therapeutic intervention in order to improve the following deficits and impairments:     Visit Diagnosis: Sacrococcygeal disorders, not elsewhere classified  Other muscle spasm     Problem List Patient Active Problem List   Diagnosis Date Noted  . Family history of breast cancer 09/10/2019  . Joint pain 10/05/2014  . Preventative health care 10/05/2014  . Excessive thirst 10/05/2014  . Hypothyroidism 10/05/2014  . Osteopenia 10/05/2014  . Family history of malignant neoplasm of gastrointestinal tract    Willa Rough DPT, ATC Willa Rough 05/24/2020, 8:46 AM  [place] MAIN [facility] SERVICES [address] [place], [place], [address] Phone: [phone]   Fax:  [phone]  Name: Sandra Baker MRN: [id number] Date of Birth: 1960/04/20

## 2020-05-24 NOTE — Patient Instructions (Signed)
Intimate Rose internal trigger point release tool $29.99$59.99  Dr. Joel Kaplan Premium Prostate Massager   $19.99  

## 2020-05-31 ENCOUNTER — Ambulatory Visit: Payer: BC Managed Care – PPO

## 2020-05-31 ENCOUNTER — Other Ambulatory Visit: Payer: Self-pay

## 2020-05-31 DIAGNOSIS — M533 Sacrococcygeal disorders, not elsewhere classified: Secondary | ICD-10-CM

## 2020-05-31 DIAGNOSIS — M62838 Other muscle spasm: Secondary | ICD-10-CM

## 2020-05-31 NOTE — Therapy (Signed)
[place] MAIN [facility] SERVICES [address] [place], [place], [address] Phone: [phone]   Fax:  [phone]  Physical Therapy Treatment and Discharge Summary  The patient has been informed of current processes in place at Outpatient Rehab to protect patients from Covid-19 exposure including social distancing, schedule modifications, and new cleaning procedures. After discussing their particular risk with a therapist based on the patient's personal risk factors, the patient has decided to proceed with in-person therapy.  Patient Details  Name: Sandra Baker MRN: [id number] Date of Birth: [birth year] No data recorded  Encounter Date: 05/31/2020   PT End of Session - 05/31/20 0746    Visit Number 5    Number of Visits 12    Date for PT Re-Evaluation 07/27/20    Authorization - Visit Number 5    Authorization - Number of Visits 12    Progress Note Due on Visit 10    PT Start Time 6962    PT Stop Time 0830    PT Time Calculation (min) 55 min    Activity Tolerance Patient tolerated treatment well;No increased pain    Behavior During Therapy WFL for tasks assessed/performed           Past Medical History:  Diagnosis Date  . Anxiety   . Arthritis   . Chicken pox   . Complication of anesthesia   . Diffuse cystic mastopathy 2013  . Dysplastic nevus 07/20/2009   L mid lat bicep - mild   . Family history of malignant neoplasm of gastrointestinal tract 2013  . Hemorrhage of rectum and anus 2011  . History of lymph node biopsy 2003  . Hypothyroidism   . Internal hemorrhoids with other complication 9528  . PONV (postoperative nausea and vomiting)   . Special screening for malignant neoplasms, colon 2013    Past Surgical History:  Procedure Laterality Date  . BREAST EXCISIONAL BIOPSY Left 1980's    lympnode bx neg  . COLONOSCOPY  2010   done by Dr. Jamal Collin  . GANGLION CYST EXCISION Left 03/19/2019   Procedure: EXCISION OF LEFT DORSAL  CARPAL GANGLION;  Surgeon: Corky Mull, MD;  Location: [facility] ORS;  Service: Orthopedics;  Laterality: Left;  . LYMPH NODE BIOPSY  1991    There were no vitals filed for this visit.    Pelvic Floor Physical Therapy Treatment Note  SCREENING  Changes in medications, allergies, or medical history?: none    SUBJECTIVE  Patient reports: She feels great, She has decided that she wants to do LYT yoga because they have live yoga classes. She still feels like she could still empty a little more after urinating but there is no discomfort anymore. She is urinating no more than 6-8 times per day.  Precautions:  Anxiety, hypothyroidism, Internal hemorrhoids, osteopenia, Arthritis in her R hand.    Pain update: She is not having pain.  ** mild soreness in R hip following TPDN today.  Patient Goals: To not have discomfort and feel like she is able to empty her bladder fully.     OBJECTIVE  Changes in: Posture/Observations:  R anterior rotation. ** following treatment ASIS and PSIS appear level in standing and supine.  05/17/20: Slight R anterior rotation.  TODAY: not re-assessed  Range of Motion/Flexibilty:    Strength/MMT:  LE MMT:   Pelvic Floor External Exam: Introitus Appears: WNL Skin integrity: WNL Palpation: TTP to B IC Cough: paradoxical Prolapse visible?: no Scar mobility: decreased   Internal  Vaginal Exam: Strength (PERF): 5/5, 10 sec. Symmetry: TTP to anterior>posterior Palpation: TTP throughout Prolapse: anterior wall visible at the level of the introitus.  TODAY: TTP to all on L internally except for coccygeus.  Abdominal:   Palpation: TTP to B adductor longus and R adductor brevis.  Gait Analysis:  INTERVENTIONS THIS SESSION: Manual: Re-assessed PFM and performed TP release through all muscles on L with full resolution to decrease spasm and pain and allow for improved balance of musculature for improved function and decreased  symptoms.  Self-care: Reviewed and updated goals and discussed option to keep a follow-up appointment or D/C and Pt. Opted to D/C to HEP, feeling confident that she has all the tools necessary to maintain improvement. Discussed the importance of managing her stress/anxiety in long-term symptom relief and how yoga could be a part of this practice for her.  Therex: Reviewed plan to follow-up with LYT yoga and discussed how to use HEP following D/C and when/how to wean off of it to maintain Sx. Reduction.   Total time: 60 min.                             PT Short Term Goals - 05/31/20 0739      PT SHORT TERM GOAL #1   Title Patient will demonstrate improved pelvic alignment and balance of musculature surrounding the pelvis to facilitate decreased PFM spasms and decrease Urinary frequency/allow for improved emptying.    Baseline R anterior rotation, spasms surrounding R>L hip, decreased sacral mobility.    Time 6    Period Weeks    Status Achieved    Target Date 06/15/20      PT SHORT TERM GOAL #2   Title Patient will demonstrate HEP x1 in the clinic to demonstrate understanding and proper form to allow for further improvement.    Baseline Pt. lacks knowledge of which therapeutic exercises will help decrease her Sx.    Time 6    Period Weeks    Status Achieved    Target Date 06/15/20             PT Long Term Goals - 05/31/20 0744      PT LONG TERM GOAL #1   Title Patient will report urinating 6-8 times without medication per day over the course of the prior week to demonstrate decreased frequency.    Baseline Pt. urinating ~ every hour due to discomfort before starting myrbetriq    Time 12    Period Weeks    Status Achieved    Target Date 07/27/20      PT LONG TERM GOAL #2   Title Pt. will report feeling like she is able to fully empty the bladder to demonstrate improved function and QOL and help prevent UTI's.    Baseline Pt. reports not feeling like  she fully empties when urinating and discomfort with bladder filling. As of 05/31/20:She still feels like she could empty a little more after going but there is no discomfort.    Time 12    Period Weeks    Status Partially Met    Target Date 07/27/20      PT LONG TERM GOAL #3   Title Pt. will improve in FOTO score to maximal score in both categories to demonstrate improved function.    Baseline FOTO PFDI Urinary: 0, Urinary Problem: 98 As of 05/31/20: FOTO PFDI Urinary: 0, Urinary Problem: 98. Measuring tool not effective in  capturing deficits or progress made.    Time 12    Period Weeks    Status Achieved    Target Date 07/27/20                 Plan - 05/31/20 0747    Clinical Impression Statement Pt. has achieved or nearly achieved all goals with some potential incomplete emptying at BOS today pre-treatment. She has made excellent progress and is diligent with her HEP and both she and I believe she is capable of maintaining Sx. improvement through continued HEP and yoga practice on her own. We will D/C to HEP at this time.    Personal Factors and Comorbidities Comorbidity 2    Comorbidities Anxiety, osteopenia    Examination-Activity Limitations Toileting    Examination-Participation Restrictions Community Activity    Stability/Clinical Decision Making Evolving/Moderate complexity    Clinical Decision Making Moderate    Rehab Potential Excellent    PT Frequency --   D/C   PT Treatment/Interventions ADLs/Self Care Home Management;Biofeedback;Electrical Stimulation;Moist Heat;Ultrasound;Gait training;Stair training;Functional mobility training;Therapeutic activities;Neuromuscular re-education;Therapeutic exercise;Patient/family education;Orthotic Fit/Training;Manual techniques;Scar mobilization;Passive range of motion;Dry needling;Taping;Spinal Manipulations;Joint Manipulations    PT Next Visit Plan D/C    PT Home Exercise Plan Happy baby/child's pose, kneeling hip-flexor pose,  soda-can theory, tools    Consulted and Agree with Plan of Care Patient           Patient will benefit from skilled therapeutic intervention in order to improve the following deficits and impairments:  Increased fascial restricitons,Pain,Postural dysfunction,Hypomobility,Decreased scar mobility,Increased muscle spasms  Visit Diagnosis: Sacrococcygeal disorders, not elsewhere classified  Other muscle spasm     Problem List Patient Active Problem List   Diagnosis Date Noted  . Family history of breast cancer 09/10/2019  . Joint pain 10/05/2014  . Preventative health care 10/05/2014  . Excessive thirst 10/05/2014  . Hypothyroidism 10/05/2014  . Osteopenia 10/05/2014  . Family history of malignant neoplasm of gastrointestinal tract    Willa Rough DPT, ATC Willa Rough 05/31/2020, 9:03 AM  [place] MAIN [facility] SERVICES [place], [place], [address] Phone: [phone]   Fax:  [phone]  Name: Sandra Baker MRN: [id number] Date of Birth: September 15, 1960

## 2020-06-07 ENCOUNTER — Ambulatory Visit: Payer: BC Managed Care – PPO | Admitting: Dermatology

## 2020-06-07 ENCOUNTER — Ambulatory Visit: Payer: BC Managed Care – PPO

## 2020-06-14 ENCOUNTER — Ambulatory Visit: Payer: BC Managed Care – PPO

## 2020-06-14 ENCOUNTER — Other Ambulatory Visit: Payer: Self-pay | Admitting: Urology

## 2020-06-28 ENCOUNTER — Encounter: Payer: BC Managed Care – PPO | Admitting: Physical Therapy

## 2020-06-28 ENCOUNTER — Ambulatory Visit: Payer: BC Managed Care – PPO

## 2020-07-05 ENCOUNTER — Encounter: Payer: BC Managed Care – PPO | Admitting: Physical Therapy

## 2020-07-12 ENCOUNTER — Encounter: Payer: BC Managed Care – PPO | Admitting: Physical Therapy

## 2020-07-19 ENCOUNTER — Encounter: Payer: BC Managed Care – PPO | Admitting: Physical Therapy

## 2020-09-23 ENCOUNTER — Other Ambulatory Visit: Payer: Self-pay

## 2020-09-23 ENCOUNTER — Ambulatory Visit: Payer: BC Managed Care – PPO | Admitting: Dermatology

## 2020-09-23 DIAGNOSIS — Z86018 Personal history of other benign neoplasm: Secondary | ICD-10-CM

## 2020-09-23 DIAGNOSIS — B353 Tinea pedis: Secondary | ICD-10-CM | POA: Diagnosis not present

## 2020-09-23 DIAGNOSIS — D229 Melanocytic nevi, unspecified: Secondary | ICD-10-CM

## 2020-09-23 DIAGNOSIS — L988 Other specified disorders of the skin and subcutaneous tissue: Secondary | ICD-10-CM

## 2020-09-23 DIAGNOSIS — L578 Other skin changes due to chronic exposure to nonionizing radiation: Secondary | ICD-10-CM

## 2020-09-23 DIAGNOSIS — L821 Other seborrheic keratosis: Secondary | ICD-10-CM

## 2020-09-23 DIAGNOSIS — D1800 Hemangioma unspecified site: Secondary | ICD-10-CM

## 2020-09-23 DIAGNOSIS — Z1283 Encounter for screening for malignant neoplasm of skin: Secondary | ICD-10-CM | POA: Diagnosis not present

## 2020-09-23 DIAGNOSIS — B079 Viral wart, unspecified: Secondary | ICD-10-CM

## 2020-09-23 DIAGNOSIS — L814 Other melanin hyperpigmentation: Secondary | ICD-10-CM

## 2020-09-23 MED ORDER — KETOCONAZOLE 2 % EX CREA: 1.0000 "application " | TOPICAL_CREAM | Freq: Every day | CUTANEOUS | 4 refills | Status: DC

## 2020-09-23 NOTE — Progress Notes (Signed)
New Patient Visit  Subjective  Sandra Baker is a 60 y.o. female who presents for the following: Total body skin exam (Hx of Dysplastic nevus L mid lat bicep). She wonders about treatments for skin rejuvenation. She has a wart on finger.  Also rash of feet. The patient presents for Total-Body Skin Exam (TBSE) for skin cancer screening and mole check.  The following portions of the chart were reviewed this encounter and updated as appropriate:   Tobacco  Allergies  Meds  Problems  Med Hx  Surg Hx  Fam Hx     Review of Systems:  No other skin or systemic complaints except as noted in HPI or Assessment and Plan.  Objective  Well appearing patient in no apparent distress; mood and affect are within normal limits.  A full examination was performed including scalp, head, eyes, ears, nose, lips, neck, chest, axillae, abdomen, back, buttocks, bilateral upper extremities, bilateral lower extremities, hands, feet, fingers, toes, fingernails, and toenails. All findings within normal limits unless otherwise noted below.  L mid lat bicep Scar with no evidence of recurrence.   R ring finger Verrucous papules -- Discussed viral etiology and contagion.   Left Foot - Posterior Scaling and maceration web spaces and over distal and lateral soles.   Head - Anterior (Face) Actinic changes face  Assessment & Plan   Lentigines - Scattered tan macules - Due to sun exposure - Benign-appering, observe - Recommend daily broad spectrum sunscreen SPF 30+ to sun-exposed areas, reapply every 2 hours as needed. - Call for any changes  Seborrheic Keratoses - Stuck-on, waxy, tan-brown papules and/or plaques  - Benign-appearing - Discussed benign etiology and prognosis. - Observe - Call for any changes  Melanocytic Nevi - Tan-brown and/or pink-flesh-colored symmetric macules and papules - Benign appearing on exam today - Observation - Call clinic for new or changing moles - Recommend  daily use of broad spectrum spf 30+ sunscreen to sun-exposed areas.   Hemangiomas - Red papules - Discussed benign nature - Observe - Call for any changes  Actinic Damage - Chronic condition, secondary to cumulative UV/sun exposure - diffuse scaly erythematous macules with underlying dyspigmentation - Recommend daily broad spectrum sunscreen SPF 30+ to sun-exposed areas, reapply every 2 hours as needed.  - Staying in the shade or wearing long sleeves, sun glasses (UVA+UVB protection) and wide brim hats (4-inch brim around the entire circumference of the hat) are also recommended for sun protection.  - Call for new or changing lesions.  Skin cancer screening performed today.  History of dysplastic nevus L mid lat bicep Clear. Observe for recurrence. Call clinic for new or changing lesions.  Recommend regular skin exams, daily broad-spectrum spf 30+ sunscreen use, and photoprotection.    Viral warts, unspecified type R ring finger Discussed viral etiology and risk of spread.  Discussed multiple treatments may be required to clear warts.  Discussed possible post-treatment dyspigmentation and risk of recurrence.  Discussed LN2, pt declines and would like to start with otc wart product  Tinea pedis of left foot Left Foot - Posterior Chronic; persistent Start Ketoconazole 2% cr qhs to feet ketoconazole (NIZORAL) 2 % cream - Left Foot - Posterior Apply 1 application topically at bedtime. Qhs to feet and in between toes  Elastosis of skin Head - Anterior (Face) Start [facility] Tretinoin: 0.0125%, Niacinamide: 2% cr qhs to face  Skin cancer screening  Return in about 1 year (around 09/23/2021) for TBSE, Hx of Dysplastic nevi.  I, Othelia Pulling, RMA, am acting as scribe for Sarina Ser, MD . Documentation: I have reviewed the above documentation for accuracy and completeness, and I agree with the above.  Sarina Ser, MD

## 2020-09-23 NOTE — Patient Instructions (Addendum)
If you have any questions or concerns for your doctor, please call our main line at [phone] and press option 4 to reach your doctor's medical assistant. If no one answers, please leave a voicemail as directed and we will return your call as soon as possible. Messages left after 4 pm will be answered the following business day.   You may also send us a message via MyChart. We typically respond to MyChart messages within 1-2 business days.  For prescription refills, please ask your pharmacy to contact our office. Our fax number is [phone].  If you have an urgent issue when the clinic is closed that cannot wait until the next business day, you can page your doctor at the number below.    Please note that while we do our best to be available for urgent issues outside of office hours, we are not available 24/7.   If you have an urgent issue and are unable to reach us, you may choose to seek medical care at your doctor's office, retail clinic, urgent care center, or emergency room.  If you have a medical emergency, please immediately call 911 or go to the emergency department.  Pager Numbers  - Dr. Kowalski: [phone]  - Dr. Moye: [phone]  - Dr. Roddy: [phone]  In the event of inclement weather, please call our main line at [phone] for an update on the status of any delays or closures.  Dermatology Medication Tips: Please keep the boxes that topical medications come in in order to help keep track of the instructions about where and how to use these. Pharmacies typically print the medication instructions only on the boxes and not directly on the medication tubes.   If your medication is too expensive, please contact our office at [phone] option 4 or send us a message through MyChart.   We are unable to tell what your co-pay for medications will be in advance as this is different depending on your insurance coverage. However, we may be able to find a substitute  medication at lower cost or fill out paperwork to get insurance to cover a needed medication.   If a prior authorization is required to get your medication covered by your insurance company, please allow us 1-2 business days to complete this process.  Drug prices often vary depending on where the prescription is filled and some pharmacies may offer cheaper prices.  The website www.goodrx.com contains coupons for medications through different pharmacies. The prices here do not account for what the cost may be with help from insurance (it may be cheaper with your insurance), but the website can give you the price if you did not use any insurance.  - You can print the associated coupon and take it with your prescription to the pharmacy.  - You may also stop by our office during regular business hours and pick up a GoodRx coupon card.  - If you need your prescription sent electronically to a different pharmacy, notify our office through [facility] MyChart or by phone at [phone] option 4.  Instructions for [facility] Medications  One or more of your medications was sent to the [facility] mail order compounding pharmacy. You will receive an email from them and can purchase the medicine through that link. It will then be mailed to your home at the address you confirmed. If for any reason you do not receive an email from them, please check your spam folder. If you still do not find the email,   please let us know. [facility] phone number is [phone].   

## 2020-09-30 ENCOUNTER — Encounter: Payer: Self-pay | Admitting: Dermatology

## 2020-12-14 ENCOUNTER — Other Ambulatory Visit (HOSPITAL_COMMUNITY)
Admission: RE | Admit: 2020-12-14 | Discharge: 2020-12-14 | Disposition: A | Discharge status: Home / Self Care | Payer: BC Managed Care – PPO | Source: Ambulatory Visit | Attending: Obstetrics and Gynecology | Admitting: Obstetrics and Gynecology

## 2020-12-14 ENCOUNTER — Ambulatory Visit (INDEPENDENT_AMBULATORY_CARE_PROVIDER_SITE_OTHER): Payer: BC Managed Care – PPO | Admitting: Obstetrics and Gynecology

## 2020-12-14 ENCOUNTER — Other Ambulatory Visit: Payer: Self-pay

## 2020-12-14 ENCOUNTER — Encounter: Payer: Self-pay | Admitting: Obstetrics and Gynecology

## 2020-12-14 VITALS — BP 112/72 | HR 68 | Ht 62.0 in | Wt 114.0 lb

## 2020-12-14 DIAGNOSIS — Z1239 Encounter for other screening for malignant neoplasm of breast: Secondary | ICD-10-CM

## 2020-12-14 DIAGNOSIS — Z124 Encounter for screening for malignant neoplasm of cervix: Secondary | ICD-10-CM | POA: Diagnosis not present

## 2020-12-14 DIAGNOSIS — Z01419 Encounter for gynecological examination (general) (routine) without abnormal findings: Secondary | ICD-10-CM | POA: Diagnosis not present

## 2020-12-14 NOTE — Patient Instructions (Signed)
[facility] [address] [place] [address]  [facility]  [address] [place] [address]  Phone: [phone]  

## 2020-12-14 NOTE — Progress Notes (Signed)
Gynecology Annual Exam  PCP: Dion Body, MD  Chief Complaint:  Chief Complaint  Patient presents with   Gynecologic Exam    Annual - no concerns. RM 4    History of Present Illness:Patient is a 60 y.o. G2P2002 presents for annual exam. The patient has no complaints today.   LMP: No LMP recorded. Patient is postmenopausal. No PMB  The patient does perform self breast exams. No breast changes noted.  family history of breast or ovarian cancer in her family.  The patient wears seatbelts: yes.   The patient has regular exercise: not asked.    The patient denies current symptoms of depression.     Review of Systems: Review of Systems  Constitutional:  Negative for chills and fever.  HENT:  Negative for congestion.   Respiratory:  Negative for cough and shortness of breath.   Cardiovascular:  Negative for chest pain and palpitations.  Gastrointestinal:  Negative for abdominal pain, constipation, diarrhea, heartburn, nausea and vomiting.  Genitourinary:  Negative for dysuria, frequency and urgency.  Skin:  Negative for itching and rash.  Neurological:  Negative for dizziness and headaches.  Endo/Heme/Allergies:  Negative for polydipsia.  Psychiatric/Behavioral:  Negative for depression.    Past Medical History:  Patient Active Problem List   Diagnosis Date Noted   Family history of breast cancer 09/10/2019   Joint pain 10/05/2014   Preventative health care 10/05/2014   Excessive thirst 10/05/2014   Hypothyroidism 10/05/2014   Osteopenia 10/05/2014   Family history of malignant neoplasm of gastrointestinal tract     Past Surgical History:  Past Surgical History:  Procedure Laterality Date   BREAST EXCISIONAL BIOPSY Left 1980's    lympnode bx neg   COLONOSCOPY  2010   done by Dr. Mary Sella CYST EXCISION Left 03/19/2019   Procedure: EXCISION OF LEFT DORSAL CARPAL GANGLION;  Surgeon: Corky Mull, MD;  Location: [facility] ORS;  Service: Orthopedics;   Laterality: Left;   LYMPH NODE BIOPSY  1991    Gynecologic History:  No LMP recorded. Patient is postmenopausal. Last Pap: Results were: 12/09/2019 NIL and HR HPV negative  Last mammogram: 3/3/20222 Results were: BI-RAD I  Obstetric History: [id number]  Family History:  Family History  Problem Relation Age of Onset   Colon cancer Other        family history of colon cancer   Cancer Mother 103       colon   Cancer Maternal Grandfather        colon   Breast cancer Cousin 36       x 2   Breast cancer Cousin 32   Breast cancer Paternal Grandmother     Social History:  Social History   Socioeconomic History   Marital status: Married    Spouse name: Not on file   Number of children: Not on file   Years of education: Not on file   Highest education level: Not on file  Occupational History   Not on file  Tobacco Use   Smoking status: Never   Smokeless tobacco: Never  Vaping Use   Vaping Use: Never used  Substance and Sexual Activity   Alcohol use: Yes    Alcohol/week: 0.0 standard drinks    Comment: OCC WINE   Drug use: No   Sexual activity: Yes    Birth control/protection: None  Other Topics Concern   Not on file  Social History Narrative   Not on file  Social Determinants of Health   Financial Resource Strain: Not on file  Food Insecurity: Not on file  Transportation Needs: Not on file  Physical Activity: Not on file  Stress: Not on file  Social Connections: Not on file  Intimate Partner Violence: Not on file    Allergies:  No Known Allergies  Medications: Prior to Admission medications   Medication Sig Start Date End Date Taking? Authorizing Provider  Calcium Carbonate-Vit D-Min (CALTRATE 600+D PLUS PO) Take 1 tablet by mouth daily with lunch.   Yes [provider]  ketoconazole (NIZORAL) 2 % cream Apply 1 application topically at bedtime. Qhs to feet and in between toes 09/23/20  Yes Ralene Bathe, MD  levothyroxine (SYNTHROID) 75 MCG  tablet Take 75 mcg by mouth daily before breakfast.   Yes [provider]  magnesium oxide (MAG-OX) 400 MG tablet Take 400-800 mg by mouth at bedtime.   Yes [provider]  naproxen sodium (ALEVE) 220 MG tablet Take 220 mg by mouth daily as needed (pain.).   Yes [provider]  mirabegron ER (MYRBETRIQ) 50 MG TB24 tablet Take 1 tablet (50 mg total) by mouth daily. 04/05/20   Bjorn Loser, MD  traZODone (DESYREL) 50 MG tablet Take 50 mg by mouth at bedtime.  11/08/17 03/06/20  [provider]    Physical Exam Vitals: Blood pressure 112/72, pulse 68, height 5\' 2"  (1.575 m), weight 114 lb (51.7 kg).  General: NAD HEENT: normocephalic, anicteric Thyroid: no enlargement, no palpable nodules Pulmonary: No increased work of breathing, CTAB Cardiovascular: RRR, distal pulses 2+ Breast: Breast symmetrical, no tenderness, no palpable nodules or masses, no skin or nipple retraction present, no nipple discharge.  No axillary or supraclavicular lymphadenopathy. Abdomen: NABS, soft, non-tender, non-distended.  Umbilicus without lesions.  No hepatomegaly, splenomegaly or masses palpable. No evidence of hernia  Genitourinary:  External: Normal external female genitalia.  Normal urethral meatus, normal Bartholin's and Skene's glands.    Vagina: Normal vaginal mucosa, no evidence of prolapse.    Cervix: Grossly normal in appearance, no bleeding  Uterus: Non-enlarged, mobile, normal contour.  No CMT  Adnexa: ovaries non-enlarged, no adnexal masses  Rectal: deferred  Lymphatic: no evidence of inguinal lymphadenopathy Extremities: no edema, erythema, or tenderness Neurologic: Grossly intact Psychiatric: mood appropriate, affect full  Female chaperone present for pelvic and breast  portions of the physical exam     Assessment: 60 y.o. G2P2002 routine annual exam  Plan: Problem List Items Addressed This Visit   None Visit Diagnoses     Encounter for  gynecological examination without abnormal finding    -  Primary   Screening for malignant neoplasm of cervix       Relevant Orders   MM DIAG BREAST TOMO BILATERAL   Breast screening       Relevant Orders   Cytology - PAP (Completed)       1) Mammogram - recommend yearly screening mammogram.  Mammogram Is up to date  2) STI screening  was notoffered and therefore not obtained  3) ASCCP guidelines and rational discussed.  Patient opts for yearly screening interval  4) Osteoporosis  - per USPTF routine screening DEXA at age 4  5) Routine healthcare maintenance including cholesterol, diabetes screening discussed managed by PCP  6)  Return in about 1 year (around 12/14/2021) for annual.    Malachy Mood, MD [place] P[place], [place] 12/14/2020, 9:38 AM

## 2020-12-20 LAB — CYTOLOGY - PAP
Comment: NEGATIVE
Diagnosis: NEGATIVE
High risk HPV: NEGATIVE

## 2020-12-23 ENCOUNTER — Encounter: Payer: Self-pay | Admitting: Obstetrics and Gynecology

## 2021-01-05 ENCOUNTER — Other Ambulatory Visit: Payer: Self-pay

## 2021-01-05 ENCOUNTER — Ambulatory Visit: Payer: BC Managed Care – PPO | Admitting: Dermatology

## 2021-01-05 DIAGNOSIS — L821 Other seborrheic keratosis: Secondary | ICD-10-CM

## 2021-01-05 DIAGNOSIS — B079 Viral wart, unspecified: Secondary | ICD-10-CM

## 2021-01-05 DIAGNOSIS — L820 Inflamed seborrheic keratosis: Secondary | ICD-10-CM

## 2021-01-05 NOTE — Patient Instructions (Signed)

## 2021-01-05 NOTE — Progress Notes (Signed)
° °  Follow-Up Visit   Subjective  Sandra Baker is a 60 y.o. female who presents for the following: check spot (Chest, noticed 4m ago, just noticed) and Warts (R 4th finger, has tried otc wart bandaids). The patient has spots, moles and lesions to be evaluated, some may be new or changing and the patient has concerns that these could be cancer.  The following portions of the chart were reviewed this encounter and updated as appropriate:   Tobacco   Allergies   Meds   Problems   Med Hx   Surg Hx   Fam Hx      Review of Systems:  No other skin or systemic complaints except as noted in HPI or Assessment and Plan.  Objective  Well appearing patient in no apparent distress; mood and affect are within normal limits.  A focused examination was performed including cehst, right hand. Relevant physical exam findings are noted in the Assessment and Plan.  R ring finger DIP x 1 Verrucous papule 0.4cm-- Discussed viral etiology and contagion   R chest x 2, Total = 2 Erythematous keratotic or waxy stuck-on papule or plaque.    Assessment & Plan   Seborrheic Keratoses - Stuck-on, waxy, tan-brown papules and/or plaques  - Benign-appearing - Discussed benign etiology and prognosis. - Observe - Call for any changes  Viral warts, unspecified type R ring finger DIP x 1  Discussed viral etiology and risk of spread.  Discussed multiple treatments may be required to clear warts.  Discussed possible post-treatment dyspigmentation and risk of recurrence.  Destruction of lesion - R ring finger DIP x 1 Complexity: simple   Destruction method: cryotherapy   Informed consent: discussed and consent obtained   Timeout:  patient name, date of birth, surgical site, and procedure verified Lesion destroyed using liquid nitrogen: Yes   Region frozen until ice ball extended beyond lesion: Yes   Outcome: patient tolerated procedure well with no complications   Post-procedure details: wound care  instructions given    Inflamed seborrheic keratosis R chest x 2, Total = 2 Destruction of lesion - R chest x 2, Total = 2 Complexity: simple   Destruction method: cryotherapy   Informed consent: discussed and consent obtained   Timeout:  patient name, date of birth, surgical site, and procedure verified Lesion destroyed using liquid nitrogen: Yes   Region frozen until ice ball extended beyond lesion: Yes   Outcome: patient tolerated procedure well with no complications   Post-procedure details: wound care instructions given    Return for as scheduled for TBSE.  I, Othelia Pulling, RMA, am acting as scribe for Sarina Ser, MD . Documentation: I have reviewed the above documentation for accuracy and completeness, and I agree with the above.  Sarina Ser, MD

## 2021-01-10 ENCOUNTER — Encounter: Payer: Self-pay | Admitting: Dermatology

## 2021-02-07 ENCOUNTER — Other Ambulatory Visit: Payer: Self-pay | Admitting: Obstetrics and Gynecology

## 2021-02-07 DIAGNOSIS — Z1231 Encounter for screening mammogram for malignant neoplasm of breast: Secondary | ICD-10-CM

## 2021-03-28 ENCOUNTER — Other Ambulatory Visit: Payer: Self-pay | Admitting: Family Medicine

## 2021-03-28 ENCOUNTER — Other Ambulatory Visit: Payer: Self-pay

## 2021-03-28 ENCOUNTER — Ambulatory Visit
Admission: RE | Admit: 2021-03-28 | Discharge: 2021-03-28 | Disposition: A | Discharge status: Home / Self Care | Payer: BC Managed Care – PPO | Source: Ambulatory Visit | Attending: Obstetrics and Gynecology | Admitting: Obstetrics and Gynecology

## 2021-03-28 DIAGNOSIS — Z1231 Encounter for screening mammogram for malignant neoplasm of breast: Secondary | ICD-10-CM | POA: Insufficient documentation

## 2021-09-29 ENCOUNTER — Encounter: Payer: BC Managed Care – PPO | Admitting: Dermatology

## 2021-12-13 ENCOUNTER — Other Ambulatory Visit: Payer: Self-pay | Admitting: Family Medicine

## 2021-12-13 ENCOUNTER — Ambulatory Visit
Admission: RE | Admit: 2021-12-13 | Discharge: 2021-12-13 | Disposition: A | Discharge status: Home / Self Care | Payer: BC Managed Care – PPO | Source: Ambulatory Visit | Attending: Family Medicine | Admitting: Family Medicine

## 2021-12-13 DIAGNOSIS — S0990XA Unspecified injury of head, initial encounter: Secondary | ICD-10-CM | POA: Insufficient documentation

## 2021-12-22 ENCOUNTER — Other Ambulatory Visit (HOSPITAL_COMMUNITY)
Admission: RE | Admit: 2021-12-22 | Discharge: 2021-12-22 | Disposition: A | Discharge status: Home / Self Care | Payer: BC Managed Care – PPO | Source: Ambulatory Visit | Attending: Obstetrics | Admitting: Obstetrics

## 2021-12-22 ENCOUNTER — Ambulatory Visit (INDEPENDENT_AMBULATORY_CARE_PROVIDER_SITE_OTHER): Payer: BC Managed Care – PPO | Admitting: Obstetrics

## 2021-12-22 ENCOUNTER — Encounter: Payer: Self-pay | Admitting: Obstetrics

## 2021-12-22 VITALS — BP 104/67 | HR 68 | Ht 62.0 in | Wt 111.1 lb

## 2021-12-22 DIAGNOSIS — Z124 Encounter for screening for malignant neoplasm of cervix: Secondary | ICD-10-CM | POA: Insufficient documentation

## 2021-12-22 DIAGNOSIS — Z01419 Encounter for gynecological examination (general) (routine) without abnormal findings: Secondary | ICD-10-CM | POA: Diagnosis not present

## 2021-12-22 DIAGNOSIS — Z1231 Encounter for screening mammogram for malignant neoplasm of breast: Secondary | ICD-10-CM

## 2021-12-22 NOTE — Progress Notes (Signed)
SUBJECTIVE  HPI  Sandra Baker is a 61 y.o.-year-old G2P2002 who presents for an annual gynecological exam and Pap smear. She prefers yearly Pap smears. She has no health concerns today. She is postmenopausal. She denies any abnormal vaginal bleeding or discharge, pelvic pain, dyspareunia, and UTI symptoms. She reports vaginal dryness. She is currently sexually active with one partner.   Medical/Surgical History Past Medical History:  Diagnosis Date   Anxiety    Arthritis    Chicken pox    Complication of anesthesia    Diffuse cystic mastopathy 2013   Dysplastic nevus 07/20/2009   L mid lat bicep - mild    Family history of malignant neoplasm of gastrointestinal tract 2013   Hemorrhage of rectum and anus 2011   History of lymph node biopsy 2003   Hypothyroidism    Internal hemorrhoids with other complication 1610   PONV (postoperative nausea and vomiting)    Special screening for malignant neoplasms, colon 2013   Past Surgical History:  Procedure Laterality Date   BREAST EXCISIONAL BIOPSY Left 1980's    lympnode bx neg   COLONOSCOPY  2010   done by Dr. Mary Sella CYST EXCISION Left 03/19/2019   Procedure: EXCISION OF LEFT DORSAL CARPAL GANGLION;  Surgeon: Corky Mull, MD;  Location: [facility] ORS;  Service: Orthopedics;  Laterality: Left;   LYMPH NODE BIOPSY  1991    Social History Lives with husband. Feels safe at home. Work: Nutritional therapist for [employer] Exercise: yoga, [employer], HIIT Substances: occasional EtOh; denies tobacco, vape, and recreational drugs  Obstetric History OB History     Gravida  2   Para  2   Term  2   Preterm      AB      Living  2      SAB      IAB      Ectopic      Multiple      Live Births  1        Obstetric Comments  Age first pregnancy-30 Age first menstruation-16 Last menstrual period-age 50          GYN/Menstrual History No LMP recorded. Patient is postmenopausal. Last Pap: 12/14/2020. Normal  cytology, negative HPV Contraception: postmenopausal  Prevention Endorses regular dental and eye exams Mammogram: yearly Colonoscopy:  PCP manages Flu shot/vaccines  Current Medications Outpatient Medications Prior to Visit  Medication Sig   Calcium Carbonate-Vit D-Min (CALTRATE 600+D PLUS PO) Take 1 tablet by mouth daily with lunch.   levothyroxine (SYNTHROID) 75 MCG tablet Take 75 mcg by mouth daily before breakfast.   magnesium oxide (MAG-OX) 400 MG tablet Take 400-800 mg by mouth at bedtime. (Patient not taking: Reported on 12/22/2021)   mirabegron ER (MYRBETRIQ) 50 MG TB24 tablet Take 1 tablet (50 mg total) by mouth daily. (Patient not taking: Reported on 12/22/2021)   traZODone (DESYREL) 50 MG tablet Take 50 mg by mouth at bedtime.    [DISCONTINUED] ketoconazole (NIZORAL) 2 % cream Apply 1 application topically at bedtime. Qhs to feet and in between toes   [DISCONTINUED] naproxen sodium (ALEVE) 220 MG tablet Take 220 mg by mouth daily as needed (pain.).   No facility-administered medications prior to visit.     ROS History obtained from the patient General ROS: negative for - chills, fatigue, fever, or hot flashes Psychological ROS: positive for - anxiety Ophthalmic ROS: negative for - blurry vision or decreased vision ENT ROS: negative for - headaches, sinus pain, or  sore throat Hematological and Lymphatic ROS: positive for - bruising negative for - bleeding problems or swollen lymph nodes Endocrine ROS: negative for - breast changes, mood swings, palpitations, polydipsia/polyuria, temperature intolerance, or unexpected weight changes Breast ROS: negative for breast lumps Respiratory ROS: no cough, shortness of breath, or wheezing Cardiovascular ROS: no chest pain or dyspnea on exertion Gastrointestinal ROS: no abdominal pain, change in bowel habits, or black or bloody stools Genito-Urinary ROS: no dysuria, trouble voiding, or hematuria positive for - vaginal  dryness Musculoskeletal ROS: negative Dermatological ROS: negative      No data to display           OBJECTIVE  Last Weight  Most recent update: 12/22/2021  8:40 AM    Weight  50.4 kg (111 lb 1.6 oz)             Body mass index is 20.32 kg/m.    BP 104/67   Pulse 68   Ht '5\' 2"'$  (1.575 m)   Wt 111 lb 1.6 oz (50.4 kg)   BMI 20.32 kg/m  General appearance: alert, cooperative, and appears stated age Head: Normocephalic, without obvious abnormality, atraumatic Eyes: negative findings: lids and lashes normal and conjunctivae and sclerae normal Neck: no adenopathy, supple, symmetrical, trachea midline, and thyroid not enlarged, symmetric, no tenderness/mass/nodules Lungs: clear to auscultation bilaterally Breasts: normal appearance, no masses or tenderness, Inspection negative, No nipple retraction or dimpling, No nipple discharge or bleeding, No axillary or supraclavicular adenopathy, Normal to palpation without dominant masses Heart: regular rate and rhythm, S1, S2 normal, no murmur, click, rub or gallop Abdomen: soft, non-tender; bowel sounds normal; no masses,  no organomegaly Pelvic: cervix normal in appearance, external genitalia normal, no cervical motion tenderness, rectovaginal septum normal, and vagina normal without discharge Extremities: extremities normal, atraumatic, no cyanosis or edema Pulses: 2+ and symmetric Skin: Skin color, texture, turgor normal. No rashes or lesions Lymph nodes: Cervical, supraclavicular, and axillary nodes normal.  ASSESSMENT  1) Annual exam 2) Desires Pap smear  PLAN 1) Physical exam as noted. Encouraged healthy lifestyle choices. Discussed vaginal moisturizer or estrogen cream. Declines STI testing. Routine labs done by PCP.  Mammogram ordered. 2) Pap collected. F/u based on results. Reviewed ASCCP recommendations.  Return in one year for annual exam or as needed for concerns.   Lloyd Huger, CNM

## 2021-12-23 LAB — CYTOLOGY - PAP
Comment: NEGATIVE
Diagnosis: NEGATIVE
High risk HPV: NEGATIVE

## 2022-01-10 ENCOUNTER — Telehealth: Payer: Self-pay

## 2022-01-10 NOTE — Telephone Encounter (Signed)
Patient LM on VM calling to refill skin medicinals   Tretinoin: 0.0125%, Niacinamide,  I called LM on VM please return to the office, over 1 year since pt was in the office, please return for refills

## 2022-02-20 ENCOUNTER — Ambulatory Visit: Payer: BC Managed Care – PPO | Admitting: Dermatology

## 2022-02-20 ENCOUNTER — Encounter: Payer: Self-pay | Admitting: Dermatology

## 2022-02-20 VITALS — BP 111/71 | HR 59

## 2022-02-20 DIAGNOSIS — L988 Other specified disorders of the skin and subcutaneous tissue: Secondary | ICD-10-CM

## 2022-02-20 DIAGNOSIS — L578 Other skin changes due to chronic exposure to nonionizing radiation: Secondary | ICD-10-CM

## 2022-02-20 DIAGNOSIS — D229 Melanocytic nevi, unspecified: Secondary | ICD-10-CM

## 2022-02-20 DIAGNOSIS — Z79899 Other long term (current) drug therapy: Secondary | ICD-10-CM

## 2022-02-20 DIAGNOSIS — L814 Other melanin hyperpigmentation: Secondary | ICD-10-CM

## 2022-02-20 DIAGNOSIS — Z7189 Other specified counseling: Secondary | ICD-10-CM

## 2022-02-20 DIAGNOSIS — Z1283 Encounter for screening for malignant neoplasm of skin: Secondary | ICD-10-CM

## 2022-02-20 DIAGNOSIS — M67449 Ganglion, unspecified hand: Secondary | ICD-10-CM

## 2022-02-20 DIAGNOSIS — L821 Other seborrheic keratosis: Secondary | ICD-10-CM

## 2022-02-20 DIAGNOSIS — L738 Other specified follicular disorders: Secondary | ICD-10-CM | POA: Diagnosis not present

## 2022-02-20 DIAGNOSIS — Z86018 Personal history of other benign neoplasm: Secondary | ICD-10-CM

## 2022-02-20 DIAGNOSIS — M67442 Ganglion, left hand: Secondary | ICD-10-CM | POA: Diagnosis not present

## 2022-02-20 NOTE — Progress Notes (Signed)
Follow-Up Visit   Subjective  Sandra Baker is a 62 y.o. female who presents for the following: Annual Exam (1 year tbse. Hx of dysplastic. Hx of spots at face, patient would like a few things treated at face. ). The patient presents for Total-Body Skin Exam (TBSE) for skin cancer screening and mole check.  The patient has spots, moles and lesions to be evaluated, some may be new or changing and the patient has concerns that these could be cancer.  The following portions of the chart were reviewed this encounter and updated as appropriate:  Tobacco  Allergies  Meds  Problems  Med Hx  Surg Hx  Fam Hx     Review of Systems: No other skin or systemic complaints except as noted in HPI or Assessment and Plan.  Objective  Well appearing patient in no apparent distress; mood and affect are within normal limits.  A full examination was performed including scalp, head, eyes, ears, nose, lips, neck, chest, axillae, abdomen, back, buttocks, bilateral upper extremities, bilateral lower extremities, hands, feet, fingers, toes, fingernails, and toenails. All findings within normal limits unless otherwise noted below.  face Rhytides and volume loss.   left index finger PIP Groove with nail dystrophy           Assessment & Plan  Elastosis of skin face Continue Medicinals Tretinoin: 0.0125%, Niacinamide: 2% cr qhs to face   Sebaceous hyperplasia of face Head - Anterior (Face) Discussed cosmetic procedure electrodesiccation, noncovered.  $60 for 1st lesion and $15 for each additional lesion if done on the same day.  Maximum charge $350.  One touch-up treatment included no charge. Discussed risks of treatment including dyspigmentation, small scar, and/or recurrence. Recommend daily broad spectrum sunscreen SPF 30+/photoprotection to treated areas once healed.  Discussed isotretinoin, patient defers  Patient deferred treatment at this time  Digital mucous cyst With nail  dystrophy/longitudinal indentation left index finger PIP A digital mucous cyst also known as a myxoid cyst or pseudocyst is a ganglion cyst arising from the distal interphalangeal (DIP) joint of the finger or thumb (or, less commonly, toe). The cysts are believed to form from degeneration of connective tissue and are associated with osteoarthritic joints or injury. Although the exact etiology is unknown, it is likely that a small tear forms in a joint capsule or tendon sheath, allowing extravasation of synovial fluid into the adjacent tissue. When the fluid reacts with local tissue, it becomes more gelatinous and a cyst wall forms. With any treatment, there is a high rate of recurrence.   Treatment options include: - Puncture / Incision & Drainage (I&D) - Intralesional steroid injection - Intralesional Sclerosant injection (Asclera/ Polidocanol) - Intralesional steroid + sclerosant - Corticosteroid tape - Cryosurgery - Laser (CO2) - Infrared photocoagulation - Excision / Surgery  Patient prefers treating with Ln2 today. Will recheck in 3 months  Destruction of lesion - left index finger PIP Complexity: simple   Destruction method: cryotherapy   Informed consent: discussed and consent obtained   Timeout:  patient name, date of birth, surgical site, and procedure verified Lesion destroyed using liquid nitrogen: Yes   Region frozen until ice ball extended beyond lesion: Yes   Outcome: patient tolerated procedure well with no complications   Post-procedure details: wound care instructions given    Lentigines - Scattered tan macules - Due to sun exposure - Benign-appearing, observe - Recommend daily broad spectrum sunscreen SPF 30+ to sun-exposed areas, reapply every 2 hours as needed. - Call  for any changes  Sebaceous Hyperplasia At forehead - Small yellow papules with a central dell - Benign - Observe  Seborrheic Keratoses - Stuck-on, waxy, tan-brown papules and/or plaques  -  Benign-appearing - Discussed benign etiology and prognosis. - Observe - Call for any changes  Melanocytic Nevi - Tan-brown and/or pink-flesh-colored symmetric macules and papules - Benign appearing on exam today - Observation - Call clinic for new or changing moles - Recommend daily use of broad spectrum spf 30+ sunscreen to sun-exposed areas.   Hemangiomas - Red papules - Discussed benign nature - Observe - Call for any changes  Actinic Damage - Chronic condition, secondary to cumulative UV/sun exposure - diffuse scaly erythematous macules with underlying dyspigmentation - Recommend daily broad spectrum sunscreen SPF 30+ to sun-exposed areas, reapply every 2 hours as needed.  - Staying in the shade or wearing long sleeves, sun glasses (UVA+UVB protection) and wide brim hats (4-inch brim around the entire circumference of the hat) are also recommended for sun protection.  - Call for new or changing lesions.  History of Dysplastic Nevi Left lateral bicep - No evidence of recurrence today - Recommend regular full body skin exams - Recommend daily broad spectrum sunscreen SPF 30+ to sun-exposed areas, reapply every 2 hours as needed.  - Call if any new or changing lesions are noted between office visits  Skin cancer screening performed today.  Garry Heater, CMA, am acting as scribe for Sarina Ser, MD.  Return in about 3 months (around 05/22/2022) for follow up on digital mucous cyst, 1 yr tbse . Documentation: I have reviewed the above documentation for accuracy and completeness, and I agree with the above.  Sarina Ser, MD

## 2022-02-20 NOTE — Patient Instructions (Addendum)
For digital mucous cyst at left index finger  A digital mucous cyst also known as a myxoid cyst or pseudocyst is a ganglion cyst arising from the distal interphalangeal (DIP) joint of the finger or thumb (or, less commonly, toe). The cysts are believed to form from degeneration of connective tissue and are associated with osteoarthritic joints or injury. Although the exact etiology is unknown, it is likely that a small tear forms in a joint capsule or tendon sheath, allowing extravasation of synovial fluid into the adjacent tissue. When the fluid reacts with local tissue, it becomes more gelatinous and a cyst wall forms. With any treatment, there is a high rate of recurrence.   Treatment options include: - Puncture / Incision & Drainage (I&D) - Intralesional steroid injection - Intralesional Sclerosant injection (Asclera/ Polidocanol) - Intralesional steroid + sclerosant - Corticosteroid tape - Cryosurgery - Laser (CO2) - Infrared photocoagulation - Excision / Surgery   Cryotherapy Aftercare  Wash gently with soap and water everyday.   Apply Vaseline and Band-Aid daily until healed.      Instructions for [facility] Medications  One or more of your medications was sent to the [facility] mail order compounding pharmacy. You will receive an email from them and can purchase the medicine through that link. It will then be mailed to your home at the address you confirmed. If for any reason you do not receive an email from them, please check your spam folder. If you still do not find the email, please let us know. [facility] phone number is [phone].  Topical retinoid medications like tretinoin/Retin-A, adapalene/Differin, tazarotene/Fabior, and Epiduo/Epiduo Forte can cause dryness and irritation when first started. Only apply a pea-sized amount to the entire affected area. Avoid applying it around the eyes, edges of mouth and creases at the nose. If you experience  irritation, use a good moisturizer first and/or apply the medicine less often. If you are doing well with the medicine, you can increase how often you use it until you are applying every night. Be careful with sun protection while using this medication as it can make you sensitive to the sun. This medicine should not be used by pregnant women.        Seborrheic Keratosis  What causes seborrheic keratoses? Seborrheic keratoses are harmless, common skin growths that first appear during adult life.  As time goes by, more growths appear.  Some people may develop a large number of them.  Seborrheic keratoses appear on both covered and uncovered body parts.  They are not caused by sunlight.  The tendency to develop seborrheic keratoses can be inherited.  They vary in color from skin-colored to gray, brown, or even black.  They can be either smooth or have a rough, warty surface.   Seborrheic keratoses are superficial and look as if they were stuck on the skin.  Under the microscope this type of keratosis looks like layers upon layers of skin.  That is why at times the top layer may seem to fall off, but the rest of the growth remains and re-grows.    Treatment Seborrheic keratoses do not need to be treated, but can easily be removed in the office.  Seborrheic keratoses often cause symptoms when they rub on clothing or jewelry.  Lesions can be in the way of shaving.  If they become inflamed, they can cause itching, soreness, or burning.  Removal of a seborrheic keratosis can be accomplished by freezing, burning, or surgery. If any spot  bleeds, scabs, or grows rapidly, please return to have it checked, as these can be an indication of a skin cancer.     Melanoma ABCDEs  Melanoma is the most dangerous type of skin cancer, and is the leading cause of death from skin disease.  You are more likely to develop melanoma if you: Have light-colored skin, light-colored eyes, or red or blond hair Spend a lot of  time in the sun Tan regularly, either outdoors or in a tanning bed Have had blistering sunburns, especially during childhood Have a close family member who has had a melanoma Have atypical moles or large birthmarks  Early detection of melanoma is key since treatment is typically straightforward and cure rates are extremely high if we catch it early.   The first sign of melanoma is often a change in a mole or a new dark spot.  The ABCDE system is a way of remembering the signs of melanoma.  A for asymmetry:  The two halves do not match. B for border:  The edges of the growth are irregular. C for color:  A mixture of colors are present instead of an even brown color. D for diameter:  Melanomas are usually (but not always) greater than 66m - the size of a pencil eraser. E for evolution:  The spot keeps changing in size, shape, and color.  Please check your skin once per month between visits. You can use a small mirror in front and a large mirror behind you to keep an eye on the back side or your body.   If you see any new or changing lesions before your next follow-up, please call to schedule a visit.  Please continue daily skin protection including broad spectrum sunscreen SPF 30+ to sun-exposed areas, reapplying every 2 hours as needed when you're outdoors.   Staying in the shade or wearing long sleeves, sun glasses (UVA+UVB protection) and wide brim hats (4-inch brim around the entire circumference of the hat) are also recommended for sun protection.     Due to recent changes in healthcare laws, you may see results of your pathology and/or laboratory studies on MyChart before the doctors have had a chance to review them. We understand that in some cases there may be results that are confusing or concerning to you. Please understand that not all results are received at the same time and often the doctors may need to interpret multiple results in order to provide you with the best plan of care  or course of treatment. Therefore, we ask that you please give u[place]2 business days to thoroughly review all your results before contacting the office for clarification. Should we see a critical lab result, you will be contacted sooner.   If You Need Anything After Your Visit  If you have any questions or concerns for your doctor, please call our main line at [phone] press option 4 to reach your doctor's medical assistant. If no one answers, please leave a voicemail as directed and we will return your call as soon as possible. Messages left after 4 pm will be answered the following business day.   You may also send [place] message via [facility] We typically respond to MyChart messages within 1-2 business days.  For prescription refills, please ask your pharmacy to contact our office. Our fax number is [phone]  If you have an urgent issue when the clinic is closed that cannot wait until the next business day, you can page your  doctor at the number below.    Please note that while we do our best to be available for urgent issues outside of office hours, we are not available 24/7.   If you have an urgent issue and are unable to reach [place], you may choose to seek medical care at your doctor's office, retail clinic, urgent care center, or emergency room.  If you have a medical emergency, please immediately call 911 or go to the emergency department.  Pager Numbers  - Dr. Nehemiah Massed: [phone]  - Dr. Laurence Ferrari: [phone]  - Dr. Nicole Kindred: [phone]  In the event of inclement weather, please call our main line at [phone] for an update on the status of any delays or closures.  Dermatology Medication Tips: Please keep the boxes that topical medications come in in order to help keep track of the instructions about where and how to use these. Pharmacies typically print the medication instructions only on the boxes and not directly on the medication tubes.   If your medication is too  expensive, please contact our office at [phone] option 4 or send [place] a message through [place].   We are unable to tell what your co-pay for medications will be in advance as this is different depending on your insurance coverage. However, we may be able to find a substitute medication at lower cost or fill out paperwork to get insurance to cover a needed medication.   If a prior authorization is required to get your medication covered by your insurance company, please allow [place] 1-2 business days to complete this process.  Drug prices often vary depending on where the prescription is filled and some pharmacies may offer cheaper prices.  The website www.goodrx.com contains coupons for medications through different pharmacies. The prices here do not account for what the cost may be with help from insurance (it may be cheaper with your insurance), but the website can give you the price if you did not use any insurance.  - You can print the associated coupon and take it with your prescription to the pharmacy.  - You may also stop by our office during regular business hours and pick up a GoodRx coupon card.  - If you need your prescription sent electronically to a different pharmacy, notify our office through [facility] or by phone at [phone] option 4.     Si Usted Necesita Algo Despus de Su Visita  Tambin puede enviarnos un mensaje a travs de Pharmacist, community. Por lo general respondemos a los mensajes de MyChart en el transcurso de 1 a 2 das hbiles.  Para renovar recetas, por favor pida a su farmacia que se ponga en contacto con nuestra oficina. Harland Dingwall de fax es [place] [phone].  Si tiene un asunto urgente cuando la clnica est cerrada y que no puede esperar hasta el siguiente da hbil, puede llamar/localizar a su doctor(a) al nmero que aparece a continuacin.   Por favor, tenga en cuenta que aunque hacemos todo lo posible para estar disponibles para asuntos urgentes fuera  del horario de [place], no estamos disponibles las 24 horas del da, los 7 das de la [place].   Si tiene un problema urgente y no puede comunicarse con nosotros, puede optar por buscar atencin mdica  en el consultorio de su doctor(a), en una clnica privada, en un centro de atencin urgente o en una sala de emergencias.  Si tiene Engineering geologist, por favor llame inmediatamente al 911 o vaya a la sala de emergencias.  Nmeros de bper  - Dr. Nehemiah Massed: [phone]  - Dra. Moye: [phone]  - Dra. Nicole Kindred: [phone]  En caso de inclemencias del [place], por favor llame a Johnsie Kindred principal al [phone] para una actualizacin sobre el [place] de cualquier retraso o cierre.  Consejos para la medicacin en dermatologa: Por favor, guarde las cajas en las que vienen los medicamentos de uso tpico para ayudarle a seguir las instrucciones sobre dnde y cmo usarlos. Las farmacias generalmente imprimen las instrucciones del medicamento slo en las cajas y no directamente en los tubos del [place].   Si su medicamento es muy caro, por favor, pngase en contacto con Zigmund Daniel llamando al [phone] y presione la opcin 4 o envenos un mensaje a travs de Pharmacist, community.   No podemos decirle cul ser su copago por los medicamentos por adelantado ya que esto es diferente dependiendo de la cobertura de su seguro. Sin embargo, es posible que podamos encontrar un medicamento sustituto a Electrical engineer un formulario para que el seguro cubra el medicamento que se considera necesario.   Si se requiere una autorizacin previa para que su compaa de seguros [place] su medicamento, por favor permtanos de 1 a 2 das hbiles para completar este proceso.  Los precios de los medicamentos varan con frecuencia dependiendo del Environmental consultant de dnde se surte la receta y alguna farmacias pueden ofrecer precios ms baratos.  El sitio web www.goodrx.com tiene cupones para medicamentos de Office manager. Los precios aqu no tienen en cuenta lo que podra costar con la ayuda del seguro (puede ser ms barato con su seguro), pero el sitio web puede darle el precio si no utiliz Research scientist (physical sciences).  - Puede imprimir el cupn correspondiente y llevarlo con su receta a la farmacia.  - Tambin puede pasar por nuestra oficina durante el horario de atencin regular y Charity fundraiser una tarjeta de cupones de GoodRx.  - Si necesita que su receta se enve electrnicamente a una farmacia diferente, informe a nuestra oficina a travs de MyChart de [place] o por telfono llamando al [phone] y presione la opcin 4.

## 2022-02-24 ENCOUNTER — Encounter: Payer: Self-pay | Admitting: Dermatology

## 2022-05-17 ENCOUNTER — Ambulatory Visit: Payer: BC Managed Care – PPO | Admitting: Dermatology

## 2022-05-25 ENCOUNTER — Ambulatory Visit
Admission: RE | Admit: 2022-05-25 | Discharge: 2022-05-25 | Disposition: A | Discharge status: Home / Self Care | Payer: BC Managed Care – PPO | Source: Ambulatory Visit | Attending: Obstetrics | Admitting: Obstetrics

## 2022-05-25 DIAGNOSIS — Z1231 Encounter for screening mammogram for malignant neoplasm of breast: Secondary | ICD-10-CM | POA: Insufficient documentation

## 2022-08-31 ENCOUNTER — Ambulatory Visit: Payer: BC Managed Care – PPO | Admitting: Dermatology

## 2022-08-31 DIAGNOSIS — Z79899 Other long term (current) drug therapy: Secondary | ICD-10-CM

## 2022-08-31 DIAGNOSIS — M67449 Ganglion, unspecified hand: Secondary | ICD-10-CM

## 2022-08-31 DIAGNOSIS — Z7189 Other specified counseling: Secondary | ICD-10-CM

## 2022-08-31 DIAGNOSIS — M67442 Ganglion, left hand: Secondary | ICD-10-CM

## 2022-08-31 DIAGNOSIS — L603 Nail dystrophy: Secondary | ICD-10-CM

## 2022-08-31 NOTE — Progress Notes (Signed)
   Follow-Up Visit   Subjective  Sandra Baker is a 62 y.o. female who presents for the following: Digital mucous cyst of the L index finger PIP joint, patient c/o nail dystrophy that hasn't resolved, but digital mucous cyst is overall improved after LN2 treatment last visit. The patient has spots, moles and lesions to be evaluated, some may be new or changing and the patient may have concern these could be cancer.  The following portions of the chart were reviewed this encounter and updated as appropriate: medications, allergies, medical history  Review of Systems:  No other skin or systemic complaints except as noted in HPI or Assessment and Plan.  Objective  Well appearing patient in no apparent distress; mood and affect are within normal limits. A focused examination was performed of the following areas: the face and hands Relevant exam findings are noted in the Assessment and Plan.  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 Condition is improving with treatment but not currently at goal.  A digital mucous cyst also known as a myxoid cyst or pseudocyst is a ganglion cyst arising from the distal interphalangeal (DIP) joint of the finger or thumb (or, less commonly, toe). The cysts are believed to form from degeneration of connective tissue and are associated with osteoarthritic joints or injury. Although the exact etiology is unknown, it is likely that a small tear forms in a joint capsule or tendon sheath, allowing extravasation of synovial fluid into the adjacent tissue. When the fluid reacts with local tissue, it becomes more gelatinous and a cyst wall forms. With any treatment, there is a high rate of recurrence.   Treatment options include: - Puncture / Incision & Drainage (I&D) -  Intralesional steroid injection - Intralesional Sclerosant injection (Asclera/ Polidocanol) - Intralesional steroid + sclerosant - Corticosteroid tape - Cryosurgery - Laser (CO2) - Infrared photocoagulation - Excision / Surgery  Patient prefers treating with Ln2 today. Discussed referral to hand surgeon for removal, but may still recur.   Destruction of lesion - left index finger PIP Complexity: simple   Destruction method: cryotherapy   Informed consent: discussed and consent obtained   Timeout:  patient name, date of birth, surgical site, and procedure verified Lesion destroyed using liquid nitrogen: Yes   Region frozen until ice ball extended beyond lesion: Yes   Outcome: patient tolerated procedure well with no complications   Post-procedure details: wound care instructions given    Return for appointment as scheduled.  Maylene Roes, CMA, am acting as scribe for Armida Sans, MD .  Documentation: I have reviewed the above documentation for accuracy and completeness, and I agree with the above.  Armida Sans, MD

## 2022-08-31 NOTE — Patient Instructions (Signed)

## 2022-09-10 ENCOUNTER — Encounter: Payer: Self-pay | Admitting: Dermatology

## 2022-12-25 ENCOUNTER — Encounter: Payer: Self-pay | Admitting: Obstetrics

## 2022-12-25 ENCOUNTER — Ambulatory Visit (INDEPENDENT_AMBULATORY_CARE_PROVIDER_SITE_OTHER): Payer: BC Managed Care – PPO | Admitting: Obstetrics

## 2022-12-25 VITALS — BP 106/66 | HR 65 | Ht 62.0 in | Wt 109.0 lb

## 2022-12-25 DIAGNOSIS — K5909 Other constipation: Secondary | ICD-10-CM

## 2022-12-25 DIAGNOSIS — Z1231 Encounter for screening mammogram for malignant neoplasm of breast: Secondary | ICD-10-CM

## 2022-12-25 DIAGNOSIS — Z124 Encounter for screening for malignant neoplasm of cervix: Secondary | ICD-10-CM

## 2022-12-25 DIAGNOSIS — Z800 Family history of malignant neoplasm of digestive organs: Secondary | ICD-10-CM

## 2022-12-25 DIAGNOSIS — Z01419 Encounter for gynecological examination (general) (routine) without abnormal findings: Secondary | ICD-10-CM | POA: Diagnosis not present

## 2022-12-25 MED ORDER — ESTROGENS CONJUGATED 0.625 MG/GM VA CREA: TOPICAL_CREAM | VAGINAL | 6 refills | Status: AC

## 2022-12-25 NOTE — Progress Notes (Signed)
ANNUAL GYNECOLOGICAL EXAM  SUBJECTIVE  HPI  Sandra Baker is a 62 y.o.-year-old [id number] who presents for an annual gynecological exam today.  She denies pelvic pain, dyspareunia, abnormal vaginal bleeding or discharge, and UTI symptoms. She has some vaginal dryness that she has been treating with vaginal estrogen cream with good results. She is concerned about new-onset constipation that has not responded to fiber and magnesium; she has a strong family h/o colon cancer.  Medical/Surgical History Past Medical History:  Diagnosis Date   Anxiety    Arthritis    Chicken pox    Complication of anesthesia    Diffuse cystic mastopathy 2013   Dysplastic nevus 07/20/2009   L mid lat bicep - mild    Family history of malignant neoplasm of gastrointestinal tract 2013   Hemorrhage of rectum and anus 2011   History of lymph node biopsy 2003   Hypothyroidism    Internal hemorrhoids with other complication 2011   PONV (postoperative nausea and vomiting)    Special screening for malignant neoplasms, colon 2013   Past Surgical History:  Procedure Laterality Date   BREAST EXCISIONAL BIOPSY Left 1980's    lympnode bx neg   COLONOSCOPY  2010   done by Dr. Britt Bottom CYST EXCISION Left 03/19/2019   Procedure: EXCISION OF LEFT DORSAL CARPAL GANGLION;  Surgeon: Christena Flake, MD;  Location: [facility] ORS;  Service: Orthopedics;  Laterality: Left;   LYMPH NODE BIOPSY  1991    Social History Lives with husband. Feels safe there Work: Paramedic. Exercise: regular excercise Substances: Occasional EtOH; denies tobacco, vape, and recreational drugs  Obstetric History OB History     Gravida  2   Para  2   Term  2   Preterm      AB      Living  2      SAB      IAB      Ectopic      Multiple      Live Births  1        Obstetric Comments  Age first pregnancy-30 Age first menstruation-16 Last menstrual period-age 43          GYN/Menstrual History No LMP  recorded. Patient is postmenopausal. Last Pap: 11/2021. NILM, negative HPV Contraception: menopause  Prevention Endorses regular dental and eye exams Mammogram: annually Colonoscopy: had a few years ago   Current Medications Outpatient Medications Prior to Visit  Medication Sig   Calcium Carbonate-Vit D-Min (CALTRATE 600+D PLUS PO) Take 1 tablet by mouth daily with lunch.   levothyroxine (SYNTHROID) 75 MCG tablet Take 75 mcg by mouth daily before breakfast.   magnesium oxide (MAG-OX) 400 MG tablet Take 400-800 mg by mouth at bedtime.   traZODone (DESYREL) 50 MG tablet Take 50 mg by mouth at bedtime.    mirabegron ER (MYRBETRIQ) 50 MG TB24 tablet Take 1 tablet (50 mg total) by mouth daily. (Patient not taking: Reported on 12/25/2022)   No facility-administered medications prior to visit.      The pregnancy intention screening data noted above was reviewed. Potential methods of contraception were discussed. The patient elected to proceed with No data recorded.   ROS Constitutional: Denied constitutional symptoms, night sweats, recent illness, fatigue, fever, insomnia and weight loss.  Eyes: Denied eye symptoms, eye pain, photophobia, vision change and visual disturbance.  Ears/Nose/Throat/Neck: Denied ear, nose, throat or neck symptoms, hearing loss, nasal discharge, sinus congestion and sore throat.  Cardiovascular: Denied  cardiovascular symptoms, arrhythmia, chest pain/pressure, edema, exercise intolerance, orthopnea and palpitations.  Respiratory: Denied pulmonary symptoms, asthma, pleuritic pain, productive sputum, cough, dyspnea and wheezing.  Gastrointestinal: See HPI  Genitourinary: Denied genitourinary symptoms including symptomatic vaginal discharge, pelvic relaxation issues, and urinary complaints.  Musculoskeletal: Joint pain in hands  Dermatologic: Denied dermatology symptoms, rash and scar.  Neurologic: Denied neurology symptoms, dizziness, headache, neck pain and  syncope.  Psychiatric: Denied psychiatric symptoms, anxiety and depression.  Endocrine: Denied endocrine symptoms including hot flashes and night sweats.    OBJECTIVE  BP 106/66   Pulse 65   Ht 5\' 2"  (1.575 m)   Wt 109 lb (49.4 kg)   BMI 19.94 kg/m    Physical examination General NAD, Conversant  HEENT Atraumatic; Op clear with mmm.  Normo-cephalic. Pupils reactive. Anicteric sclerae  Thyroid/Neck Smooth without nodularity or enlargement. Normal ROM.  Neck Supple.  Skin No rashes, lesions or ulceration. Normal palpated skin turgor. No nodularity.  Breasts: Declined  Lungs: Clear to auscultation.No rales or wheezes. Normal Respiratory effort, no retractions.  Heart: NSR.  No murmurs or rubs appreciated. No peripheral edema  Abdomen: Soft.  Non-tender.  No masses.  No HSM. No hernia  Extremities: Moves all appropriately.  Normal ROM for age. No lymphadenopathy.  Neuro: Oriented to PPT.  Normal mood. Normal affect.     Pelvic: Declined    ASSESSMENT  1) Annual exam 2) Vaginal dryness 3) Chronic constipation with family h/o colon CA  PLAN 1) Physical exam as noted. Discussed healthy lifestyle choices and preventive care. Discussed recommendations for Pap smears. Declines STI testing and routine labs today. 2) Rx sent for estrogen cream 3) Referral to GI  Return in one year for annual exam or as needed for concerns.   Guadlupe Spanish, CNM

## 2023-02-21 ENCOUNTER — Ambulatory Visit: Payer: BC Managed Care – PPO | Admitting: Dermatology

## 2023-03-27 ENCOUNTER — Ambulatory Visit: Payer: BC Managed Care – PPO | Admitting: Dermatology

## 2023-04-04 ENCOUNTER — Ambulatory Visit: Payer: Self-pay

## 2023-04-04 DIAGNOSIS — Z800 Family history of malignant neoplasm of digestive organs: Secondary | ICD-10-CM | POA: Diagnosis not present

## 2023-04-04 DIAGNOSIS — Z1211 Encounter for screening for malignant neoplasm of colon: Secondary | ICD-10-CM | POA: Diagnosis present

## 2023-06-07 ENCOUNTER — Ambulatory Visit: Admitting: Dermatology

## 2023-06-21 ENCOUNTER — Ambulatory Visit
Admission: RE | Admit: 2023-06-21 | Discharge: 2023-06-21 | Disposition: A | Discharge status: Home / Self Care | Source: Ambulatory Visit | Attending: Obstetrics | Admitting: Obstetrics

## 2023-06-21 DIAGNOSIS — Z1231 Encounter for screening mammogram for malignant neoplasm of breast: Secondary | ICD-10-CM | POA: Insufficient documentation

## 2023-08-10 ENCOUNTER — Other Ambulatory Visit: Payer: Self-pay | Admitting: Family Medicine

## 2023-08-10 DIAGNOSIS — E7800 Pure hypercholesterolemia, unspecified: Secondary | ICD-10-CM

## 2023-08-16 ENCOUNTER — Ambulatory Visit: Admitting: Dermatology

## 2023-08-16 DIAGNOSIS — L820 Inflamed seborrheic keratosis: Secondary | ICD-10-CM

## 2023-08-16 DIAGNOSIS — Z1283 Encounter for screening for malignant neoplasm of skin: Secondary | ICD-10-CM

## 2023-08-16 DIAGNOSIS — L578 Other skin changes due to chronic exposure to nonionizing radiation: Secondary | ICD-10-CM

## 2023-08-16 DIAGNOSIS — Z86018 Personal history of other benign neoplasm: Secondary | ICD-10-CM

## 2023-08-16 DIAGNOSIS — L700 Acne vulgaris: Secondary | ICD-10-CM

## 2023-08-16 DIAGNOSIS — L709 Acne, unspecified: Secondary | ICD-10-CM | POA: Diagnosis not present

## 2023-08-16 DIAGNOSIS — Z79899 Other long term (current) drug therapy: Secondary | ICD-10-CM

## 2023-08-16 DIAGNOSIS — L738 Other specified follicular disorders: Secondary | ICD-10-CM

## 2023-08-16 DIAGNOSIS — W908XXA Exposure to other nonionizing radiation, initial encounter: Secondary | ICD-10-CM

## 2023-08-16 DIAGNOSIS — L814 Other melanin hyperpigmentation: Secondary | ICD-10-CM

## 2023-08-16 DIAGNOSIS — D229 Melanocytic nevi, unspecified: Secondary | ICD-10-CM

## 2023-08-16 DIAGNOSIS — Z7189 Other specified counseling: Secondary | ICD-10-CM

## 2023-08-16 NOTE — Patient Instructions (Addendum)
 [facility] Tretinoin: 0.0125%, Niacinamide: 2% cream apply topically a pea sized amount to face nightly as tolerated  Topical retinoid medications like tretinoin/Retin-A, adapalene/Differin, tazarotene/Fabior, and Epiduo/Epiduo Forte can cause dryness and irritation when first started. Only apply a pea-sized amount to the entire affected area. Avoid applying it around the eyes, edges of mouth and creases at the nose. If you experience irritation, use a good moisturizer first and/or apply the medicine less often. If you are doing well with the medicine, you can increase how often you use it until you are applying every night. Be careful with sun protection while using this medication as it can make you sensitive to the sun. This medicine should not be used by pregnant women.    Instructions for [facility] Medications  One or more of your medications was sent to the [facility] mail order compounding pharmacy. You will receive an email from them and can purchase the medicine through that link. It will then be mailed to your home at the address you confirmed. If for any reason you do not receive an email from them, please check your spam folder. If you still do not find the email, please let us  know. [facility] phone number is [phone].    Seborrheic Keratosis  What causes seborrheic keratoses? Seborrheic keratoses are harmless, common skin growths that first appear during adult life.  As time goes by, more growths appear.  Some people may develop a large number of them.  Seborrheic keratoses appear on both covered and uncovered body parts.  They are not caused by sunlight.  The tendency to develop seborrheic keratoses can be inherited.  They vary in color from skin-colored to gray, brown, or even black.  They can be either smooth or have a rough, warty surface.   Seborrheic keratoses are superficial and look as if they were stuck on the skin.  Under the microscope this type of  keratosis looks like layers upon layers of skin.  That is why at times the top layer may seem to fall off, but the rest of the growth remains and re-grows.    Treatment Seborrheic keratoses do not need to be treated, but can easily be removed in the office.  Seborrheic keratoses often cause symptoms when they rub on clothing or jewelry.  Lesions can be in the way of shaving.  If they become inflamed, they can cause itching, soreness, or burning.  Removal of a seborrheic keratosis can be accomplished by freezing, burning, or surgery. If any spot bleeds, scabs, or grows rapidly, please return to have it checked, as these can be an indication of a skin cancer.   Cryotherapy Aftercare  Wash gently with soap and water everyday.   Apply Vaseline and Band-Aid daily until healed.    Melanoma ABCDEs  Melanoma is the most dangerous type of skin cancer, and is the leading cause of death from skin disease.  You are more likely to develop melanoma if you: Have light-colored skin, light-colored eyes, or red or blond hair Spend a lot of time in the sun Tan regularly, either outdoors or in a tanning bed Have had blistering sunburns, especially during childhood Have a close family member who has had a melanoma Have atypical moles or large birthmarks  Early detection of melanoma is key since treatment is typically straightforward and cure rates are extremely high if we catch it early.   The first sign of melanoma is often a change in a mole or a new  dark spot.  The ABCDE system is a way of remembering the signs of melanoma.  A for asymmetry:  The two halves do not match. B for border:  The edges of the growth are irregular. C for color:  A mixture of colors are present instead of an even brown color. D for diameter:  Melanomas are usually (but not always) greater than 6mm - the size of a pencil eraser. E for evolution:  The spot keeps changing in size, shape, and color.  Please check your skin once  per month between visits. You can use a small mirror in front and a large mirror behind you to keep an eye on the back side or your body.   If you see any new or changing lesions before your next follow-up, please call to schedule a visit.  Please continue daily skin protection including broad spectrum sunscreen SPF 30+ to sun-exposed areas, reapplying every 2 hours as needed when you're outdoors.   Staying in the shade or wearing long sleeves, sun glasses (UVA+UVB protection) and wide brim hats (4-inch brim around the entire circumference of the hat) are also recommended for sun protection.    Due to recent changes in healthcare laws, you may see results of your pathology and/or laboratory studies on MyChart before the doctors have had a chance to review them. We understand that in some cases there may be results that are confusing or concerning to you. Please understand that not all results are received at the same time and often the doctors may need to interpret multiple results in order to provide you with the best plan of care or course of treatment. Therefore, we ask that you please give us  2 business days to thoroughly review all your results before contacting the office for clarification. Should we see a critical lab result, you will be contacted sooner.   If You Need Anything After Your Visit  If you have any questions or concerns for your doctor, please call our main line at [phone] and press option 4 to reach your doctor's medical assistant. If no one answers, please leave a voicemail as directed and we will return your call as soon as possible. Messages left after 4 pm will be answered the following business day.   You may also send us  a message via MyChart. We typically respond to MyChart messages within 1-2 business days.  For prescription refills, please ask your pharmacy to contact our office. Our fax number is [phone].  If you have an urgent issue when the clinic is  closed that cannot wait until the next business day, you can page your doctor at the number below.    Please note that while we do our best to be available for urgent issues outside of office hours, we are not available 24/7.   If you have an urgent issue and are unable to reach us , you may choose to seek medical care at your doctor's office, retail clinic, urgent care center, or emergency room.  If you have a medical emergency, please immediately call 911 or go to the emergency department.  Pager Numbers  - Dr. Hester: [phone]  - Dr. Jackquline: [phone]  - Dr. Claudene: [phone]   In the event of inclement weather, please call our main line at [phone] for an update on the status of any delays or closures.  Dermatology Medication Tips: Please keep the boxes that topical medications come in in order to help keep track of the instructions about  where and how to use these. Pharmacies typically print the medication instructions only on the boxes and not directly on the medication tubes.   If your medication is too expensive, please contact our office at [phone] option 4 or send us  a message through MyChart.   We are unable to tell what your co-pay for medications will be in advance as this is different depending on your insurance coverage. However, we may be able to find a substitute medication at lower cost or fill out paperwork to get insurance to cover a needed medication.   If a prior authorization is required to get your medication covered by your insurance company, please allow us  1-2 business days to complete this process.  Drug prices often vary depending on where the prescription is filled and some pharmacies may offer cheaper prices.  The website www.goodrx.com contains coupons for medications through different pharmacies. The prices here do not account for what the cost may be with help from insurance (it may be cheaper with your insurance), but the website can  give you the price if you did not use any insurance.  - You can print the associated coupon and take it with your prescription to the pharmacy.  - You may also stop by our office during regular business hours and pick up a GoodRx coupon card.  - If you need your prescription sent electronically to a different pharmacy, notify our office through [facility] or by phone at [phone] option 4.     Si Usted Necesita Algo Despus de Su Visita  Tambin puede enviarnos un mensaje a travs de Clinical cytogeneticist. Por lo general respondemos a los mensajes de MyChart en el transcurso de 1 a 2 das hbiles.  Para renovar recetas, por favor pida a su farmacia que se ponga en contacto con nuestra oficina. Randi lakes de fax es [place] [phone].  Si tiene un asunto urgente cuando la clnica est cerrada y que no puede esperar hasta el siguiente da hbil, puede llamar/localizar a su doctor(a) al nmero que aparece a continuacin.   Por favor, tenga en cuenta que aunque hacemos todo lo posible para estar disponibles para asuntos urgentes fuera del horario de Justice, no estamos disponibles las 24 horas del da, los 7 [address]  [address] de la [place].   Si tiene un problema urgente y no puede comunicarse con nosotros, puede optar por buscar atencin mdica  en el consultorio de su doctor(a), en una clnica privada, en un centro de atencin urgente o en una sala de emergencias.  Si tiene Engineer, drilling, por favor llame inmediatamente al 911 o vaya a la sala de emergencias.  Nmeros de bper  - Dr. Hester: [phone]  - Dra. Jackquline: [phone]  - Dr. Claudene: [phone]   En caso de inclemencias del tiempo, por favor llame a landry capes principal al [phone] para una actualizacin sobre el [place] de cualquier retraso o cierre.  Consejos para la medicacin en dermatologa: Por favor, guarde las cajas en las que vienen los medicamentos de uso tpico para ayudarle a seguir las instrucciones sobre  dnde y cmo usarlos. Las farmacias generalmente imprimen las instrucciones del medicamento slo en las cajas y no directamente en los tubos del [place].   Si su medicamento es muy caro, por favor, pngase en contacto con landry rieger llamando al [phone] y presione la opcin 4 o envenos un mensaje a travs de Clinical cytogeneticist.   No podemos decirle cul ser su copago por los medicamentos por adelantado ya que  esto es diferente dependiendo de la cobertura de su seguro. Sin embargo, es posible que podamos encontrar un medicamento sustituto a Audiological scientist un formulario para que el seguro cubra el medicamento que se considera necesario.   Si se requiere una autorizacin previa para que su compaa de seguros malta su medicamento, por favor permtanos de 1 a 2 das hbiles para completar este proceso.  Los precios de los medicamentos varan con frecuencia dependiendo del Environmental consultant de dnde se surte la receta y alguna farmacias pueden ofrecer precios ms baratos.  El sitio web www.goodrx.com tiene cupones para medicamentos de Health and safety inspector. Los precios aqu no tienen en cuenta lo que podra costar con la ayuda del seguro (puede ser ms barato con su seguro), pero el sitio web puede darle el precio si no utiliz Tourist information centre manager.  - Puede imprimir el cupn correspondiente y llevarlo con su receta a la farmacia.  - Tambin puede pasar por nuestra oficina durante el horario de atencin regular y Education officer, museum una tarjeta de cupones de GoodRx.  - Si necesita que su receta se enve electrnicamente a una farmacia diferente, informe a nuestra oficina a travs de MyChart de [place] o por telfono llamando al [phone] y presione la opcin 4.

## 2023-08-16 NOTE — Progress Notes (Signed)
 Follow-Up Visit   Subjective  Sandra Baker is a 63 y.o. female who presents for the following: Skin Cancer Screening and Full Body Skin Exam hx of dysplastic nevus, hx of digital mucous cyst at left index finger PIP, hx of tinea pedis - feels has resolved, hx of using tretinoin cream for fine lines and wrinkles and would like to discuss refills [facility],   Patient reports a spot at left upper back  The patient presents for Total-Body Skin Exam (TBSE) for skin cancer screening and mole check. The patient has spots, moles and lesions to be evaluated, some may be new or changing and the patient may have concern these could be cancer.  The following portions of the chart were reviewed this encounter and updated as appropriate: medications, allergies, medical history  Review of Systems:  No other skin or systemic complaints except as noted in HPI or Assessment and Plan.  Objective  Well appearing patient in no apparent distress; mood and affect are within normal limits.  A full examination was performed including scalp, head, eyes, ears, nose, lips, neck, chest, axillae, abdomen, back, buttocks, bilateral upper extremities, bilateral lower extremities, hands, feet, fingers, toes, fingernails, and toenails. All findings within normal limits unless otherwise noted below.   Relevant physical exam findings are noted in the Assessment and Plan.  back x 6 (6) Erythematous stuck-on, waxy papule or plaque  Assessment & Plan    ACNE  And facial elastosis Exam:   mild to moderate comedones on face.  Facial elastosis. Treatment Plan: [facility] Tretinoin: 0.0125%, Niacinamide: 2% cr qhs to face   Advised to increase to daily use when flared.  Patient advised if not improving to send mychart or call could increase strength   Topical retinoid medications like tretinoin/Retin-A, adapalene/Differin, tazarotene/Fabior, and Epiduo/Epiduo Forte can cause dryness and  irritation when first started. Only apply a pea-sized amount to the entire affected area. Avoid applying it around the eyes, edges of mouth and creases at the nose. If you experience irritation, use a good moisturizer first and/or apply the medicine less often. If you are doing well with the medicine, you can increase how often you use it until you are applying every night. Be careful with sun protection while using this medication as it can make you sensitive to the sun. This medicine should not be used by pregnant women.     Recommend daily broad spectrum sunscreen SPF 30+ to sun-exposed areas, reapply every 2 hours as needed. Call for new or changing lesions.  Staying in the shade or wearing long sleeves, sun glasses (UVA+UVB protection) and wide brim hats (4-inch brim around the entire circumference of the hat) are also recommended for sun protection.   SKIN CANCER SCREENING PERFORMED TODAY.  ACTINIC DAMAGE - Chronic condition, secondary to cumulative UV/sun exposure - diffuse scaly erythematous macules with underlying dyspigmentation - Recommend daily broad spectrum sunscreen SPF 30+ to sun-exposed areas, reapply every 2 hours as needed.  - Staying in the shade or wearing long sleeves, sun glasses (UVA+UVB protection) and wide brim hats (4-inch brim around the entire circumference of the hat) are also recommended for sun protection.  - Call for new or changing lesions.  LENTIGINES, SEBORRHEIC KERATOSES, HEMANGIOMAS - Benign normal skin lesions - Benign-appearing - Call for any changes SKs - back   MELANOCYTIC NEVI - Tan-brown and/or pink-flesh-colored symmetric macules and papules - Benign appearing on exam today - Observation - Call clinic for new or  changing moles - Recommend daily use of broad spectrum spf 30+ sunscreen to sun-exposed areas.   Sebaceous Hyperplasia At forehead - Small yellow papules with a central dell - Benign-appearing - Observe. Call for changes.  HISTORY  OF DYSPLASTIC NEVUS 07/20/2009 left mid lateral bicep - mild No evidence of recurrence today Recommend regular full body skin exams Recommend daily broad spectrum sunscreen SPF 30+ to sun-exposed areas, reapply every 2 hours as needed.  Call if any new or changing lesions are noted between office visits   INFLAMED SEBORRHEIC KERATOSIS (6) back x 6 (6) Symptomatic, irritating, patient would like treated. Destruction of lesion - back x 6 (6) Complexity: simple   Destruction method: cryotherapy   Informed consent: discussed and consent obtained   Timeout:  patient name, date of birth, surgical site, and procedure verified Lesion destroyed using liquid nitrogen: Yes   Region frozen until ice ball extended beyond lesion: Yes   Outcome: patient tolerated procedure well with no complications   Post-procedure details: wound care instructions given    Return in about 1 year (around 08/15/2024) for TBSE.  IEleanor Blush, CMA, am acting as scribe for Alm Rhyme, MD.   Documentation: I have reviewed the above documentation for accuracy and completeness, and I agree with the above.  Alm Rhyme, MD

## 2023-08-21 ENCOUNTER — Encounter: Payer: Self-pay | Admitting: Dermatology

## 2023-11-30 IMAGING — MG MM DIGITAL SCREENING BILAT W/ TOMO AND CAD
8 series · 9 of 24 positions shown · non-contrast
Comparison: Previous exam(s).

CLINICAL DATA: Screening.

EXAM:
DIGITAL SCREENING BILATERAL MAMMOGRAM WITH TOMOSYNTHESIS AND CAD
TECHNIQUE: Bilateral screening digital craniocaudal and mediolateral oblique
mammograms were obtained. Bilateral screening digital breast
tomosynthesis was performed. The images were evaluated with
computer-aided detection.

[R MLO synth-2D]
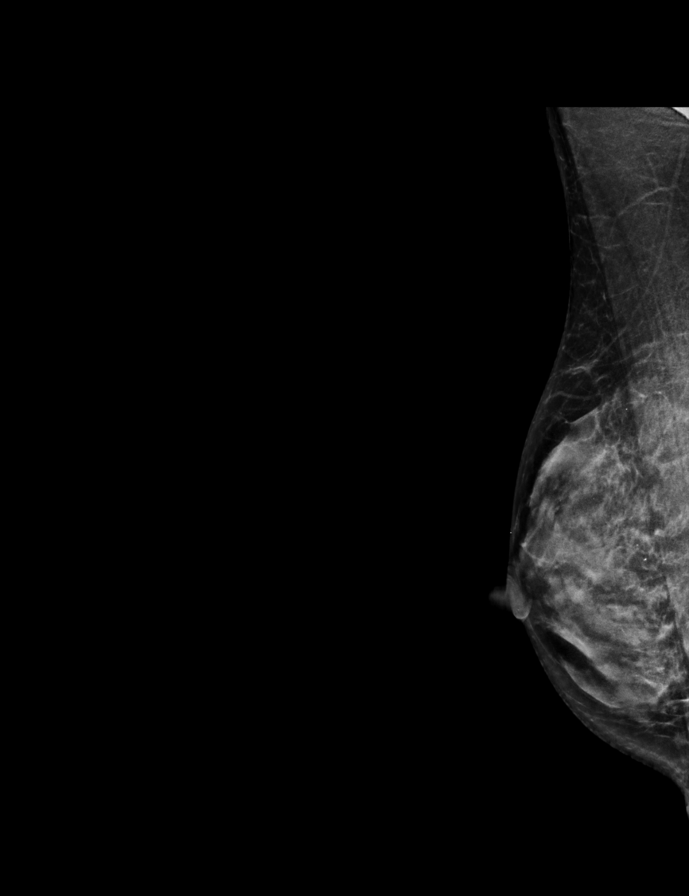

[L CC synth-2D]
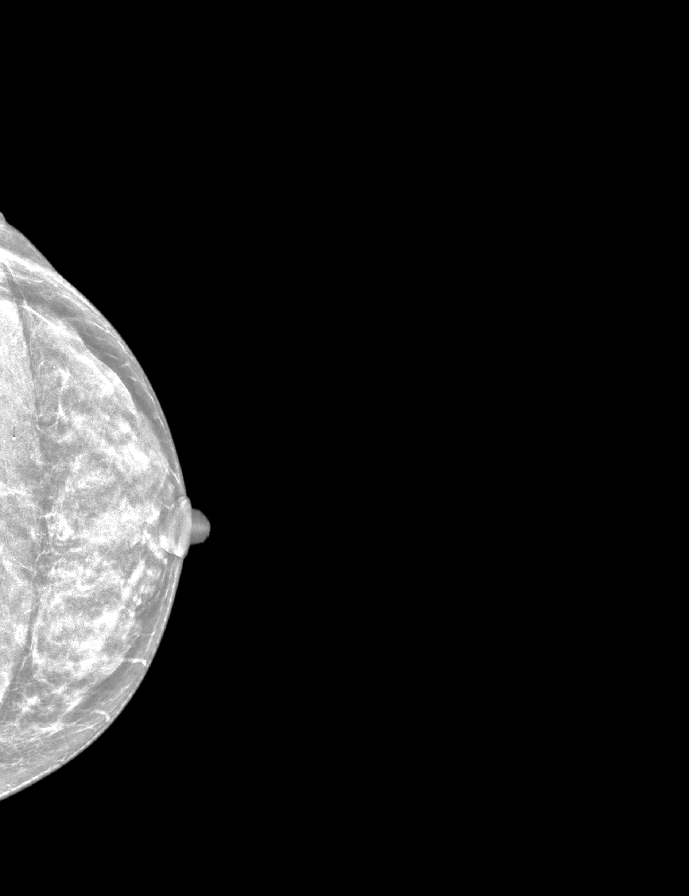

[R CC synth-2D]
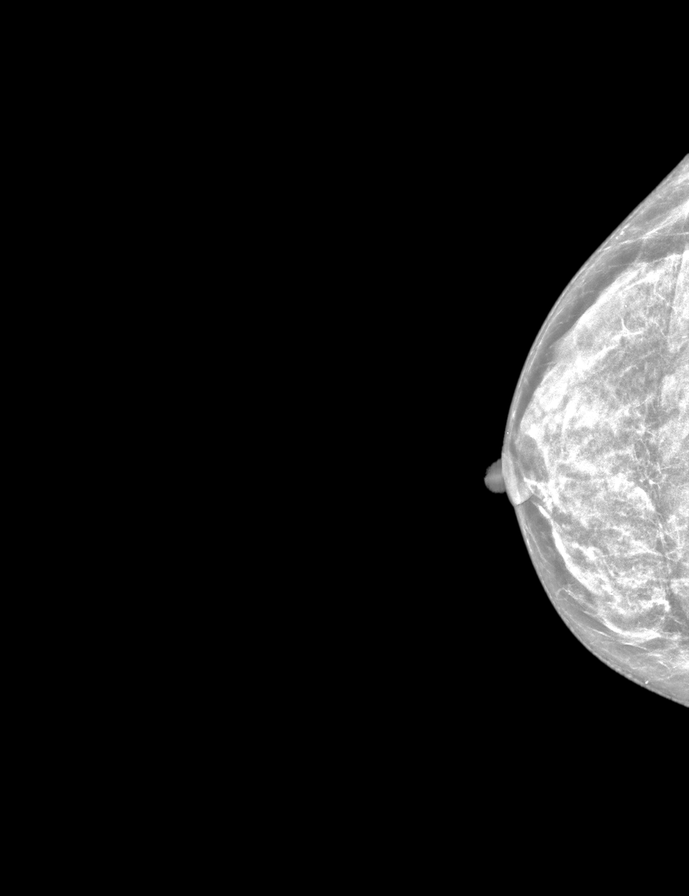

[L MLO synth-2D]
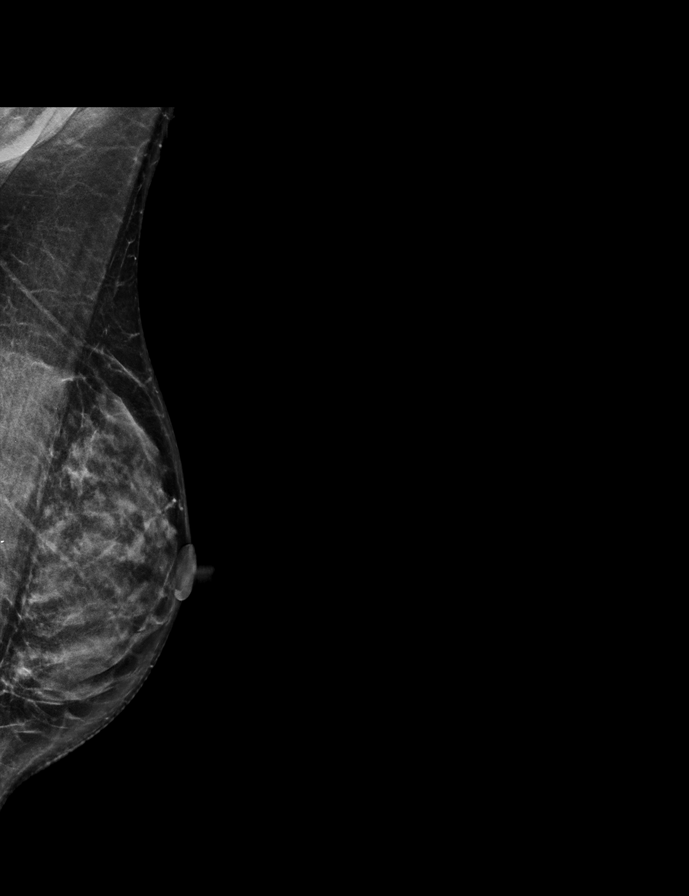

[L MLO tomo · 2 of 47 frames shown]
[frame 16/47]
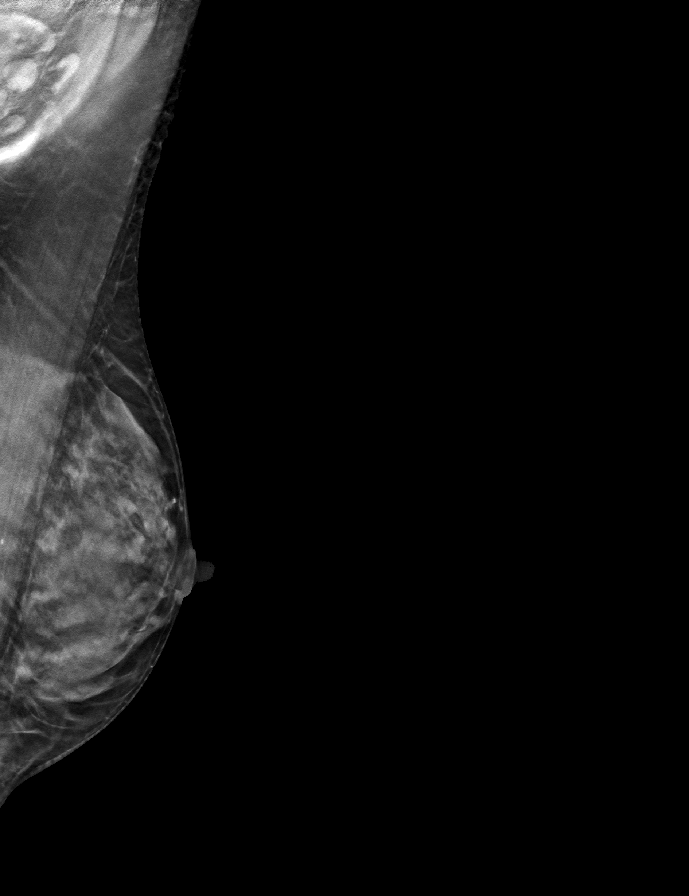
[frame 24/47]
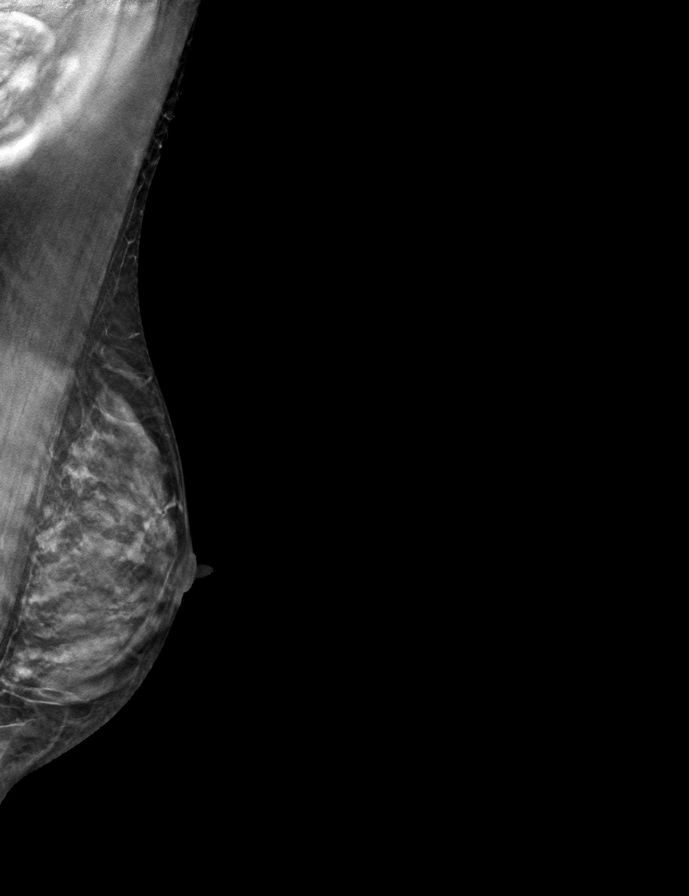

[L CC tomo · tomo slice 22/43.0]
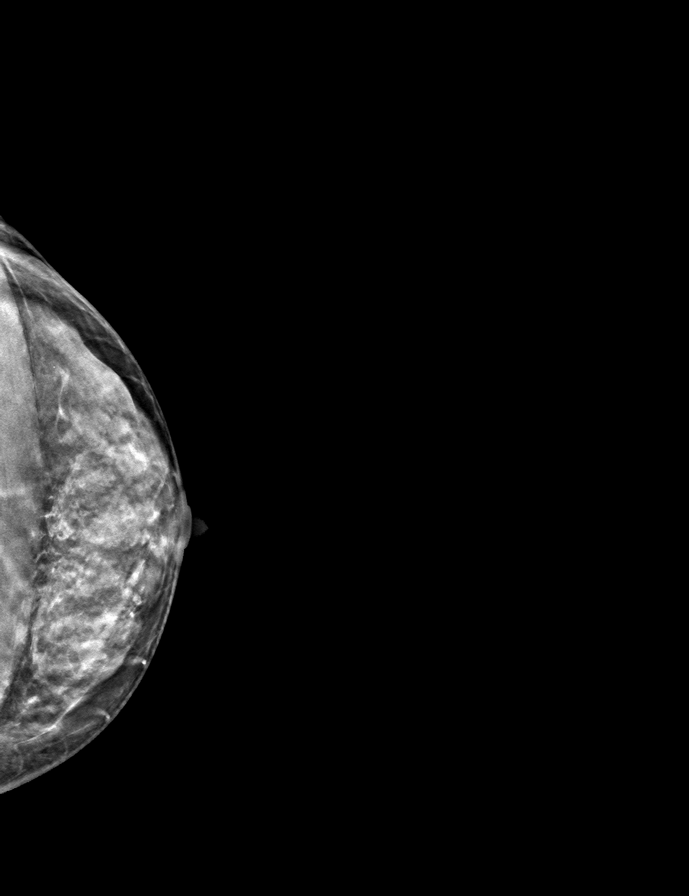

[R MLO tomo · tomo slice 23/46.0]
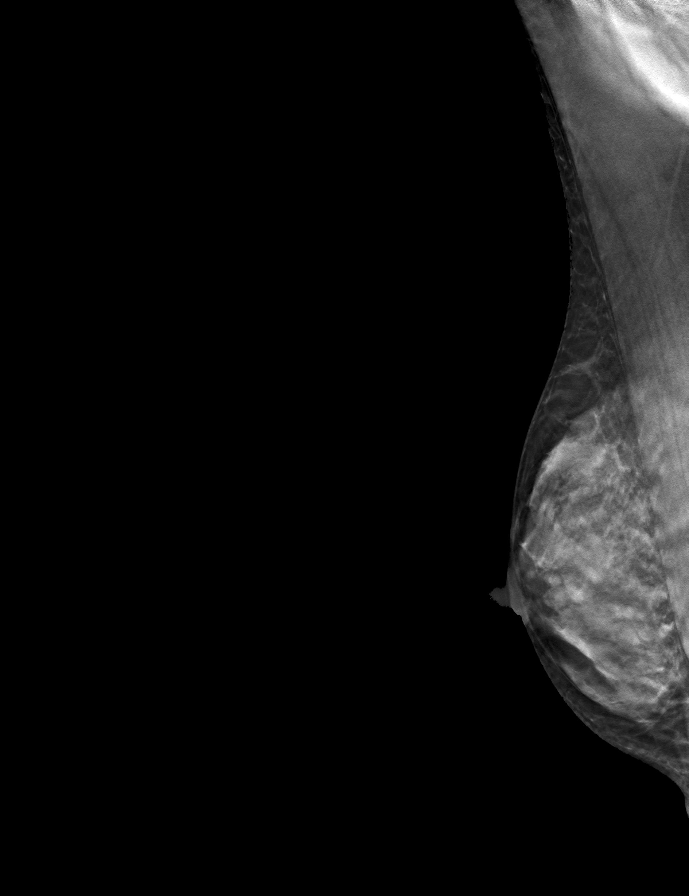

[R CC tomo · tomo slice 22/43.0]
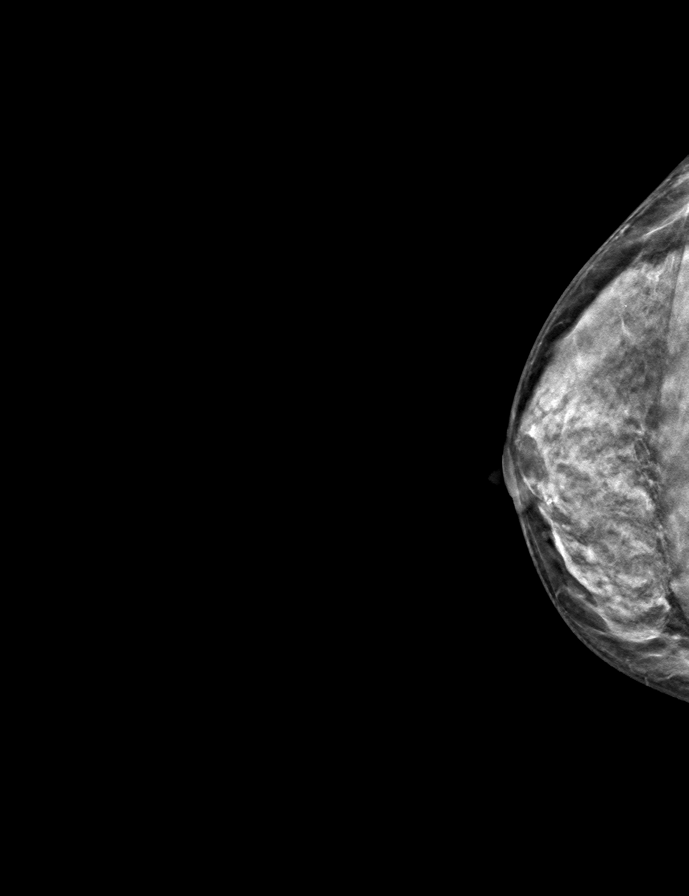

[9 of 24 positions shown; findings below may reference images not displayed]

ACR Breast Density Category d: The breast tissue is extremely dense,
which lowers the sensitivity of mammography
FINDINGS: There are no findings suspicious for malignancy.
IMPRESSION: No mammographic evidence of malignancy. A result letter of this
screening mammogram will be mailed directly to the patient.

RECOMMENDATION:
Screening mammogram in one year. (Code:TA-V-WV9)

BI-RADS CATEGORY  1: Negative.

## 2024-03-05 ENCOUNTER — Ambulatory Visit: Admitting: Obstetrics

## 2024-04-14 ENCOUNTER — Ambulatory Visit: Admitting: Dermatology

## 2024-08-21 ENCOUNTER — Ambulatory Visit: Admitting: Dermatology
